# Patient Record
Sex: Male | Born: 1990 | Race: White | Hispanic: No | Marital: Single | Smoking: Former smoker
Health system: Southern US, Community
[De-identification: ages and names within clinical notes are randomized; demographics above are authoritative.]

## PROBLEM LIST (undated history)

## (undated) DIAGNOSIS — R030 Elevated blood-pressure reading, without diagnosis of hypertension: Secondary | ICD-10-CM

## (undated) HISTORY — DX: Elevated blood-pressure reading, without diagnosis of hypertension: R03.0

---

## 2011-11-18 ENCOUNTER — Emergency Department (INDEPENDENT_AMBULATORY_CARE_PROVIDER_SITE_OTHER)
Admission: EM | Admit: 2011-11-18 | Discharge: 2011-11-18 | Disposition: A | Discharge status: Home Health | Payer: Self-pay | Source: Home / Self Care | Attending: Family Medicine | Admitting: Family Medicine

## 2011-11-18 ENCOUNTER — Encounter (HOSPITAL_COMMUNITY): Payer: Self-pay

## 2011-11-18 DIAGNOSIS — N63 Unspecified lump in unspecified breast: Secondary | ICD-10-CM

## 2011-11-18 DIAGNOSIS — J029 Acute pharyngitis, unspecified: Secondary | ICD-10-CM

## 2011-11-18 LAB — POCT RAPID STREP A: Streptococcus, Group A Screen (Direct): NEGATIVE

## 2011-11-18 NOTE — ED Provider Notes (Signed)
Kevin Shields is a 21 y.o. male who presents to Urgent Care today for  1) sore throat and fever for 3-4 days. The patient notes a fever up to 104 relieved by ibuprofen. He denies any trouble breathing vomiting diarrhea or abdominal pain. He is eating and drinking well and producing urine. Additionally he notes some enlarged lymph nodes in his neck.  2) left nipple/breast mass: Patient notes a slowly enlarging small mass near his left nipple. It is nontender and he does not have any fluid expressible from his nipple. He denies any other similar mass on the right side. He feels well otherwise. He has not had this evaluated yet. Patient denies any testicular masses and he does check himself regularly the   PMH reviewed. Otherwise healthy young man ROS as above otherwise neg.  no chest pains, palpitations, fevers, chills, abdominal pain nausea or vomiting. Medications reviewed. No current facility-administered medications for this encounter.   No current outpatient prescriptions on file.    Exam:  BP 128/61  Pulse 102  Temp(Src) 97.8 F (36.6 C) (Oral)  Resp 18  SpO2 98% Gen: Well NAD HEENT: EOMI,  MMM, anterior cervical lymphadenopathy. Posterior pharynx is erythematous with white exudate present. Lungs: CTABL Nl WOB Heart: RRR no MRG Abd: NABS, NT, ND Exts: Non edematous BL  LE, warm and well perfused.  Chest: Left chest wall significant for small approximately 1 cm freely mobile mass near the left nipple without any significant axillary lymphadenopathy. Right breast/chest is normal appearing  Results for orders placed during the hospital encounter of 11/18/11 (from the past 24 hour(s))  POCT RAPID STREP A ([facility] ONLY)     Status: Normal   Collection Time   11/18/11  6:34 PM      Component Value Range   Streptococcus, Group A Screen (Direct) NEGATIVE  NEGATIVE    No results found.  Assessment and Plan: 21 y.o. male with   1) pharyngitis. Likely viral I am suspicious for  mononucleosis. Is likely too early for a Monospot to be positive therefore we'll not obtain it today.  Will treat symptomatically with ibuprofen and follow. If not improving or worsening will return to clinic. Discuss red flag signs or symptoms. Family expresses understanding.  2) breast mass: Somewhat concerning as it appears to be growing. Exam not particularly consistent with enlarged lymph node gynecomastia or firm fixed worrisome mass.  He may have some glandular hypertrophy or other etiology. I feel that referral to [facility] for further evaluation with potential biopsy is warranted at this time. Expressed my concern. Family expresses understanding.     Rodolph Bong, MD 11/18/11 1901

## 2011-11-18 NOTE — ED Notes (Signed)
Pt c/o sore throat and fever.  Pt taking Ibuprofen 800mg  with success in reducing fever.  No relief for pain in throat.  Pt denies productive cough.  Pt also c/o mass beneath L nipple.  Pt states it has been there for approx 1 year.  Painful to palpation.

## 2011-11-18 NOTE — Discharge Instructions (Signed)
Thank you for coming in today.  I think you have mono.  Continue ibuprofen as needed. You should get better over the next few days to weeks If you do not start getting better within one or 2 weeks please come back or go to your doctor Call or go to the emergency room if you get worse, have trouble breathing, have chest pains, or palpitations.    Left chest:  I'm not sure what is causing this. I do think that further workup makes sense.  Please followup with [facility]. Call over there and say that you were referred by urgent care.

## 2011-11-19 NOTE — ED Provider Notes (Signed)
Medical screening examination/treatment/procedure(s) were performed by PGY-3 [facility] resident and as supervising physician I was immediately available for consultation/collaboration.   Sharin Grave, MD   Sharin Grave, MD 11/19/11 [phone]

## 2014-04-18 ENCOUNTER — Emergency Department (INDEPENDENT_AMBULATORY_CARE_PROVIDER_SITE_OTHER)
Admission: EM | Admit: 2014-04-18 | Discharge: 2014-04-18 | Disposition: A | Discharge status: Home / Self Care | Payer: PRIVATE HEALTH INSURANCE | Source: Home / Self Care | Attending: Family Medicine | Admitting: Family Medicine

## 2014-04-18 ENCOUNTER — Encounter (HOSPITAL_COMMUNITY): Payer: Self-pay | Admitting: Emergency Medicine

## 2014-04-18 DIAGNOSIS — R350 Frequency of micturition: Secondary | ICD-10-CM | POA: Diagnosis not present

## 2014-04-18 LAB — POCT I-STAT, CHEM 8
BUN: 14 mg/dL (ref 6–23)
CALCIUM ION: 1.26 mmol/L — AB (ref 1.12–1.23)
CHLORIDE: 101 meq/L (ref 96–112)
Creatinine, Ser: 1 mg/dL (ref 0.50–1.35)
GLUCOSE: 101 mg/dL — AB (ref 70–99)
HEMATOCRIT: 50 % (ref 39.0–52.0)
Hemoglobin: 17 g/dL (ref 13.0–17.0)
Potassium: 3.9 mEq/L (ref 3.7–5.3)
Sodium: 139 mEq/L (ref 137–147)
TCO2: 28 mmol/L (ref 0–100)

## 2014-04-18 NOTE — ED Notes (Signed)
No answer in lobby x 2.

## 2014-04-18 NOTE — ED Provider Notes (Signed)
CSN: [id number]     Arrival date & time 04/18/14  1428 History   First MD Initiated Contact with Patient 04/18/14 1534     Chief Complaint  Patient presents with  . Urinary Frequency   (Consider location/radiation/quality/duration/timing/severity/associated sxs/prior Treatment) Patient is a 23 y.o. male presenting with frequency. The history is provided by the patient.  Urinary Frequency This is a new problem. The current episode started 2 days ago. Pertinent negatives include no chest pain and no abdominal pain. Associated symptoms comments: Urinary freq.    History reviewed. No pertinent past medical history. History reviewed. No pertinent past surgical history. History reviewed. No pertinent family history. History  Substance Use Topics  . Smoking status: Current Every Day Smoker  . Smokeless tobacco: Not on file  . Alcohol Use: Yes    Review of Systems  Constitutional: Negative.   Cardiovascular: Negative for chest pain.  Gastrointestinal: Negative.  Negative for abdominal pain.  Genitourinary: Positive for frequency. Negative for dysuria and urgency.    Allergies  Penicillins and Vantin  Home Medications   Prior to Admission medications   Not on File   BP 132/91  Pulse 59  Temp(Src) 98.2 F (36.8 C) (Oral)  Resp 16  SpO2 98% Physical Exam  Nursing note and vitals reviewed. Constitutional: He is oriented to person, place, and time. He appears well-developed and well-nourished.  Abdominal: Soft. Bowel sounds are normal. He exhibits no distension and no mass. There is no tenderness. There is no rebound and no guarding.  Neurological: He is alert and oriented to person, place, and time.    ED Course  Procedures (including critical care time) Labs Review Labs Reviewed  POCT I-STAT, CHEM 8 - Abnormal; Notable for the following:    Glucose, Bld 101 (*)    Calcium, Ion 1.26 (*)    All other components within normal limits   i-stat wnl. Imaging Review No  results found.   MDM   1. Urinary frequency        Linna Hoff, MD 04/18/14 [phone]

## 2014-04-18 NOTE — Discharge Instructions (Signed)
Your sugar test was 101. No diabetes. See your doctor for check-up and other medical issues.

## 2014-04-18 NOTE — ED Notes (Signed)
Pt  Has  Numerous   Complaints  He  Wants  To  Be  Checked  For  Diabetes       As  Well   As     Dry  Skin           He  States  He  Wants  To  Be  Checked  For  All  Types  Of  Std        Yet  He  Has  No  Symptoms           pt  denys  Any  Chest  Pain  But  States  sev  Years  Ago  He  Was  Referred  To a  Cardiologist  But      Never  Micah Flesher  Because  He  Lost  His  Papers               Pt     Is  Sitting  Upright on  Exam table  Speaking in  Complete  sentances  And  Is  In no  Severe     Distress

## 2016-02-15 ENCOUNTER — Ambulatory Visit (INDEPENDENT_AMBULATORY_CARE_PROVIDER_SITE_OTHER): Payer: BC Managed Care – PPO | Admitting: Primary Care

## 2016-02-15 ENCOUNTER — Encounter: Payer: Self-pay | Admitting: Primary Care

## 2016-02-15 VITALS — BP 148/98 | HR 79 | Temp 97.7°F | Ht 70.0 in | Wt 219.0 lb

## 2016-02-15 DIAGNOSIS — L309 Dermatitis, unspecified: Secondary | ICD-10-CM | POA: Diagnosis not present

## 2016-02-15 DIAGNOSIS — I10 Essential (primary) hypertension: Secondary | ICD-10-CM | POA: Insufficient documentation

## 2016-02-15 DIAGNOSIS — Z113 Encounter for screening for infections with a predominantly sexual mode of transmission: Secondary | ICD-10-CM

## 2016-02-15 DIAGNOSIS — R030 Elevated blood-pressure reading, without diagnosis of hypertension: Secondary | ICD-10-CM | POA: Diagnosis not present

## 2016-02-15 MED ORDER — TRIAMCINOLONE ACETONIDE 0.1 % EX CREA: 1.0000 "application " | TOPICAL_CREAM | Freq: Two times a day (BID) | CUTANEOUS | Status: DC

## 2016-02-15 NOTE — Progress Notes (Signed)
Pre visit review using our clinic review tool, if applicable. No additional management support is needed unless otherwise documented below in the visit note. 

## 2016-02-15 NOTE — Progress Notes (Signed)
Subjective:    Patient ID: Kevin Shields, male    DOB: 12/03/1990, 25 y.o.   MRN: [id number]  HPI  Mr. Kevin Shields is a 25 year old male who presents today to establish care and discuss the problems mentioned below. Will obtain old records.  1) Elevated Blood Pressure Reading: Above goal in the clinic today at 148/98, no improvement upon recheck. History of elevated reading in September 2015 of 132/91. He's not recently checked his blood pressure. Denies headaches, dizziness, chest pain, visual changes. He has a family history of hypertension in his mother and grandmother. He endorses a poor diet with fast food and salty food consumption. He has gained 50 pounds in the last 1 year.  His diet consists of: Breakfast: Skips Lunch: Restaurants ([place] food, Country food) Dinner: [employer] sales [employer]izza, frozen foods Snacks: Yogurt, Ice cream, Chips Dessert: 2-3 times weekly Beverages: Water, Gatorade, soda, sweet tea  Exercise: He works out at [employer] 3-4 times weekly.  Wt Readings from Last 3 Encounters:  02/15/16 219 lb (99.338 kg)     2) Eczema: Redness and dryness to skin to bilateral nasal folds, right eye brow, and behind right ear that has been present for the past 1 year. He's been applying an OTC cream (that he cannot recall the name of) with temporary improvement. He does experience mild itching. He has noticed increased symptoms when working outdoors in the heat.   3) STI Testing: He's not been sexually active in 3 years, but would like to undergo evaluation as he was never tested prior. Denies penile discharge, itching, swelling.   Review of Systems  Eyes: Negative for visual disturbance.  Respiratory: Negative for shortness of breath.   Cardiovascular: Negative for chest pain.  Genitourinary: Negative for dysuria, frequency, discharge, penile swelling, scrotal swelling, penile pain and testicular pain.  Skin: Positive for rash.  Neurological: Negative for dizziness and headaches.         Past Medical History  Diagnosis Date  . Elevated blood pressure reading      Social History   Social History  . Marital Status: Single    Spouse Name: N/A  . Number of Children: N/A  . Years of Education: N/A   Occupational History  . Not on file.   Social History Main Topics  . Smoking status: Former Games developermoker  . Smokeless tobacco: Not on file     Comment: Quit 3 yeaars ago  . Alcohol Use: No  . Drug Use: No  . Sexual Activity: Not on file   Other Topics Concern  . Not on file   Social History Narrative   Single.   No children.   Works in [employer].   Enjoys spending time outdoors, baseball, basketball.    No past surgical history on file.  Family History  Problem Relation Age of Onset  . Hypertension Mother   . Diabetes Mother     Allergies  Allergen Reactions  . Penicillins Hives  . Vantin [Cefpodoxime]     No current outpatient prescriptions on file prior to visit.   No current facility-administered medications on file prior to visit.    BP 148/98 mmHg  Pulse 79  Temp(Src) 97.7 F (36.5 C) (Oral)  Ht 5\' 10"  (1.778 m)  Wt 219 lb (99.338 kg)  BMI 31.42 kg/m2  SpO2 98%    Objective:   Physical Exam  Constitutional: He is oriented to person, place, and time. He appears well-nourished.  Neck: Neck supple.  Cardiovascular: Normal rate and  regular rhythm.   Pulmonary/Chest: Effort normal and breath sounds normal. He has no wheezes. He has no rales.  Neurological: He is alert and oriented to person, place, and time.  Skin: Skin is warm and dry.  Mild rash to bilateral nasal folds, behind right ear, to right eyebrow. Very representative of eczema.  Psychiatric: He has a normal mood and affect.          Assessment & Plan:

## 2016-02-15 NOTE — Assessment & Plan Note (Signed)
Elevated in clinic today, even on recheck. 1 prior documentation of elevated blood pressure in chart from 2015. He's not recently checked and endorses a poor diet with no exercise. Discussed importance of regular exercise and improvement in diet to reduce blood pressure, especially given family history. Information provided regarding DASH diet. We will have him monitor blood pressure and notify me if at or above 140/90 consistently. Will allow him time to lose weight.

## 2016-02-15 NOTE — Assessment & Plan Note (Signed)
Located to bilateral nasal folds, behind right ear, right eyebrow. Prescription for low potency triamcinolone cream sent to pharmacy for twice-daily application 1-2 weeks. He is to notify me if no improvement.

## 2016-02-15 NOTE — Patient Instructions (Addendum)
Work to [employer] by decreasing fast food, pizza, salty foods, sugary drinks.  Increase consumption of vegetables, fruit, lean protein, water.  Your blood pressure should run below 140/90. Anything at or above this number is too high.  Ensure you are consuming 64 ounces of water daily.  Continue exercising.  Complete lab work prior to leaving today. I will notify you of your results once received.   Apply Triamcinolone cream twice daily to affected areas for 1-2 weeks.   Please notify me if no improvement.  It was a pleasure to meet you today! Please don't hesitate to call me with any questions. Welcome to [facility]!  DASH Eating Plan DASH stands for "Dietary Approaches to Stop Hypertension." The DASH eating plan is a healthy eating plan that has been shown to reduce high blood pressure (hypertension). Additional health benefits may include reducing the risk of type 2 diabetes mellitus, heart disease, and stroke. The DASH eating plan may also help with weight loss. WHAT DO I NEED TO KNOW ABOUT THE DASH EATING PLAN? For the DASH eating plan, you will follow these general guidelines:  Choose foods with a percent daily value for sodium of less than 5% (as listed on the food label).  Use salt-free seasonings or herbs instead of table salt or sea salt.  Check with your health care provider or pharmacist before using salt substitutes.  Eat lower-sodium products, often labeled as "lower sodium" or "no salt added."  Eat fresh foods.  Eat more vegetables, fruits, and low-fat dairy products.  Choose whole grains. Look for the word "whole" as the first word in the ingredient list.  Choose fish and skinless chicken or [place] more often than red meat. Limit fish, poultry, and meat to 6 oz (170 g) each day.  Limit sweets, desserts, sugars, and sugary drinks.  Choose heart-healthy fats.  Limit cheese to 1 oz (28 g) per day.  Eat more home-cooked food and less restaurant, buffet,  and fast food.  Limit fried foods.  Cook foods using methods other than frying.  Limit canned vegetables. If you do use them, rinse them well to decrease the sodium.  When eating at a restaurant, ask that your food be prepared with less salt, or no salt if possible. WHAT FOODS CAN I EAT? Seek help from a dietitian for individual calorie needs. Grains Whole grain or whole wheat bread. Brown rice. Whole grain or whole wheat pasta. Quinoa, bulgur, and whole grain cereals. Low-sodium cereals. Corn or whole wheat flour tortillas. Whole grain cornbread. Whole grain crackers. Low-sodium crackers. Vegetables Fresh or frozen vegetables (raw, steamed, roasted, or grilled). Low-sodium or reduced-sodium tomato and vegetable juices. Low-sodium or reduced-sodium tomato sauce and paste. Low-sodium or reduced-sodium canned vegetables.  Fruits All fresh, canned (in natural juice), or frozen fruits. Meat and Other Protein Products Ground beef (85% or leaner), grass-fed beef, or beef trimmed of fat. Skinless chicken or [place]. Ground chicken or [place]. Pork trimmed of fat. All fish and seafood. Eggs. Dried beans, peas, or lentils. Unsalted nuts and seeds. Unsalted canned beans. Dairy Low-fat dairy products, such as skim or 1% milk, 2% or reduced-fat cheeses, low-fat ricotta or cottage cheese, or plain low-fat yogurt. Low-sodium or reduced-sodium cheeses. Fats and Oils Tub margarines without trans fats. Light or reduced-fat mayonnaise and salad dressings (reduced sodium). Avocado. Safflower, olive, or canola oils. Natural peanut or almond butter. Other Unsalted popcorn and pretzels. The items listed above may not be a complete list of recommended foods or  beverages. Contact your dietitian for more options. WHAT FOODS ARE NOT RECOMMENDED? Grains White bread. White pasta. White rice. Refined cornbread. Bagels and croissants. Crackers that contain trans fat. Vegetables Creamed or fried vegetables. Vegetables  in a cheese sauce. Regular canned vegetables. Regular canned tomato sauce and paste. Regular tomato and vegetable juices. Fruits Dried fruits. Canned fruit in light or heavy syrup. Fruit juice. Meat and Other Protein Products Fatty cuts of meat. Ribs, chicken wings, bacon, sausage, bologna, salami, chitterlings, fatback, hot dogs, bratwurst, and packaged luncheon meats. Salted nuts and seeds. Canned beans with salt. Dairy Whole or 2% milk, cream, half-and-half, and cream cheese. Whole-fat or sweetened yogurt. Full-fat cheeses or blue cheese. Nondairy creamers and whipped toppings. Processed cheese, cheese spreads, or cheese curds. Condiments Onion and garlic salt, seasoned salt, table salt, and sea salt. Canned and packaged gravies. Worcestershire sauce. Tartar sauce. Barbecue sauce. Teriyaki sauce. Soy sauce, including reduced sodium. Steak sauce. Fish sauce. Oyster sauce. Cocktail sauce. Horseradish. Ketchup and mustard. Meat flavorings and tenderizers. Bouillon cubes. Hot sauce. Tabasco sauce. Marinades. Taco seasonings. Relishes. Fats and Oils Butter, stick margarine, lard, shortening, ghee, and bacon fat. Coconut, palm kernel, or palm oils. Regular salad dressings. Other Pickles and olives. Salted popcorn and pretzels. The items listed above may not be a complete list of foods and beverages to avoid. Contact your dietitian for more information. WHERE CAN I FIND MORE INFORMATION? National Heart, Lung, and Blood Institute: [employer].itwww.nhlbi.nih.gov/health/health-topics/topics/dash/   This information is not intended to replace advice given to you by your health care provider. Make sure you discuss any questions you have with your health care provider.   Document Released: 07/07/2011 Document Revised: 08/08/2014 Document Reviewed: 05/22/2013 Elsevier Interactive Patient Education [employer]2016 Elsevier Inc.

## 2016-02-16 LAB — COMPREHENSIVE METABOLIC PANEL
ALT: 44 U/L (ref 0–53)
AST: 26 U/L (ref 0–37)
Albumin: 4.9 g/dL (ref 3.5–5.2)
Alkaline Phosphatase: 95 U/L (ref 39–117)
BILIRUBIN TOTAL: 0.5 mg/dL (ref 0.2–1.2)
BUN: 12 mg/dL (ref 6–23)
CHLORIDE: 102 meq/L (ref 96–112)
CO2: 30 meq/L (ref 19–32)
Calcium: 10.2 mg/dL (ref 8.4–10.5)
Creatinine, Ser: 1.19 mg/dL (ref 0.40–1.50)
GFR: 79.64 mL/min (ref >60.00)
GLUCOSE: 81 mg/dL (ref 70–99)
Potassium: 3.9 mEq/L (ref 3.5–5.1)
Sodium: 141 mEq/L (ref 135–145)
Total Protein: 7.8 g/dL (ref 6.0–8.3)

## 2016-02-16 LAB — GC/CHLAMYDIA PROBE AMP
CT PROBE, AMP APTIMA: NOT DETECTED
GC PROBE AMP APTIMA: NOT DETECTED

## 2016-02-16 LAB — HIV ANTIBODY (ROUTINE TESTING W REFLEX): HIV 1&2 Ab, 4th Generation: NONREACTIVE

## 2016-02-16 LAB — HEPATITIS C ANTIBODY: HCV Ab: NEGATIVE

## 2016-02-16 LAB — RPR

## 2016-02-17 LAB — HSV(HERPES SMPLX)ABS-I+II(IGG+IGM)-BLD
HSV 1 Glycoprotein G Ab, IgG: 10.1 Index — ABNORMAL HIGH (ref <0.90)
Herpes Simplex Vrs I&II-IgM Ab (EIA): 0.48 INDEX

## 2016-05-04 ENCOUNTER — Encounter: Payer: Self-pay | Admitting: Primary Care

## 2016-05-04 ENCOUNTER — Ambulatory Visit (INDEPENDENT_AMBULATORY_CARE_PROVIDER_SITE_OTHER): Payer: BC Managed Care – PPO | Admitting: Primary Care

## 2016-05-04 VITALS — BP 142/100 | HR 84 | Temp 97.9°F | Ht 70.0 in | Wt 225.8 lb

## 2016-05-04 DIAGNOSIS — I10 Essential (primary) hypertension: Secondary | ICD-10-CM

## 2016-05-04 MED ORDER — LISINOPRIL 10 MG PO TABS: 10.0000 mg | ORAL_TABLET | Freq: Every day | ORAL | 0 refills | Status: DC

## 2016-05-04 NOTE — Patient Instructions (Signed)
Start Lisinopril 10 mg tablets for high blood pressure. Take 1 tablet by mouth every morning.   You must work on improvements in diet including reduction in salty, fried, fatty foods. Take a look at the information below.  Start exercising. You should be getting 150 minutes of moderate intensity exercise weekly.  Follow up in 3 weeks for re-evaluation.  It was a pleasure to see you today!  DASH Eating Plan DASH stands for "Dietary Approaches to Stop Hypertension." The DASH eating plan is a healthy eating plan that has been shown to reduce high blood pressure (hypertension). Additional health benefits may include reducing the risk of type 2 diabetes mellitus, heart disease, and stroke. The DASH eating plan may also help with weight loss. WHAT DO I NEED TO KNOW ABOUT THE DASH EATING PLAN? For the DASH eating plan, you will follow these general guidelines:  Choose foods with a percent daily value for sodium of less than 5% (as listed on the food label).  Use salt-free seasonings or herbs instead of table salt or sea salt.  Check with your health care provider or pharmacist before using salt substitutes.  Eat lower-sodium products, often labeled as "lower sodium" or "no salt added."  Eat fresh foods.  Eat more vegetables, fruits, and low-fat dairy products.  Choose whole grains. Look for the word "whole" as the first word in the ingredient list.  Choose fish and skinless chicken or [place] more often than red meat. Limit fish, poultry, and meat to 6 oz (170 g) each day.  Limit sweets, desserts, sugars, and sugary drinks.  Choose heart-healthy fats.  Limit cheese to 1 oz (28 g) per day.  Eat more home-cooked food and less restaurant, buffet, and fast food.  Limit fried foods.  Cook foods using methods other than frying.  Limit canned vegetables. If you do use them, rinse them well to decrease the sodium.  When eating at a restaurant, ask that your food be prepared with less  salt, or no salt if possible. WHAT FOODS CAN I EAT? Seek help from a dietitian for individual calorie needs. Grains Whole grain or whole wheat bread. Brown rice. Whole grain or whole wheat pasta. Quinoa, bulgur, and whole grain cereals. Low-sodium cereals. Corn or whole wheat flour tortillas. Whole grain cornbread. Whole grain crackers. Low-sodium crackers. Vegetables Fresh or frozen vegetables (raw, steamed, roasted, or grilled). Low-sodium or reduced-sodium tomato and vegetable juices. Low-sodium or reduced-sodium tomato sauce and paste. Low-sodium or reduced-sodium canned vegetables.  Fruits All fresh, canned (in natural juice), or frozen fruits. Meat and Other Protein Products Ground beef (85% or leaner), grass-fed beef, or beef trimmed of fat. Skinless chicken or [place]. Ground chicken or [place]. Pork trimmed of fat. All fish and seafood. Eggs. Dried beans, peas, or lentils. Unsalted nuts and seeds. Unsalted canned beans. Dairy Low-fat dairy products, such as skim or 1% milk, 2% or reduced-fat cheeses, low-fat ricotta or cottage cheese, or plain low-fat yogurt. Low-sodium or reduced-sodium cheeses. Fats and Oils Tub margarines without trans fats. Light or reduced-fat mayonnaise and salad dressings (reduced sodium). Avocado. Safflower, olive, or canola oils. Natural peanut or almond butter. Other Unsalted popcorn and pretzels. The items listed above may not be a complete list of recommended foods or beverages. Contact your dietitian for more options. WHAT FOODS ARE NOT RECOMMENDED? Grains White bread. White pasta. White rice. Refined cornbread. Bagels and croissants. Crackers that contain trans fat. Vegetables Creamed or fried vegetables. Vegetables in a cheese sauce. Regular canned vegetables.  Regular canned tomato sauce and paste. Regular tomato and vegetable juices. Fruits Dried fruits. Canned fruit in light or heavy syrup. Fruit juice. Meat and Other Protein Products Fatty cuts of  meat. Ribs, chicken wings, bacon, sausage, bologna, salami, chitterlings, fatback, hot dogs, bratwurst, and packaged luncheon meats. Salted nuts and seeds. Canned beans with salt. Dairy Whole or 2% milk, cream, half-and-half, and cream cheese. Whole-fat or sweetened yogurt. Full-fat cheeses or blue cheese. Nondairy creamers and whipped toppings. Processed cheese, cheese spreads, or cheese curds. Condiments Onion and garlic salt, seasoned salt, table salt, and sea salt. Canned and packaged gravies. Worcestershire sauce. Tartar sauce. Barbecue sauce. Teriyaki sauce. Soy sauce, including reduced sodium. Steak sauce. Fish sauce. Oyster sauce. Cocktail sauce. Horseradish. Ketchup and mustard. Meat flavorings and tenderizers. Bouillon cubes. Hot sauce. Tabasco sauce. Marinades. Taco seasonings. Relishes. Fats and Oils Butter, stick margarine, lard, shortening, ghee, and bacon fat. Coconut, palm kernel, or palm oils. Regular salad dressings. Other Pickles and olives. Salted popcorn and pretzels. The items listed above may not be a complete list of foods and beverages to avoid. Contact your dietitian for more information. WHERE CAN I FIND MORE INFORMATION? National Heart, Lung, and Blood Institute: CableProm[employer].itwww.nhlbi.nih.gov/health/health-topics/topics/dash/   This information is not intended to replace advice given to you by your health care provider. Make sure you discuss any questions you have with your health care provider.   Document Released: 07/07/2011 Document Revised: 08/08/2014 Document Reviewed: 05/22/2013 Elsevier Interactive Patient Education [employer]2016 Elsevier Inc.

## 2016-05-04 NOTE — Assessment & Plan Note (Signed)
BP ranging in the 140's-90-100's since last visit. Long discussion today regarding importance of lowering BP through healthy diet and exercising. He doesn't feel as though he can exercise or improve his diet. Given lack of motivation to lose weight/improve his diet, elevated BP levels on multiple occurences, and family history, will initiate treatment. Rx for Lisinopril 10 mg sent to pharmacy. He will monitor his BP for the next several weeks and bring readings to follow up appointment. Follow up in 3 weeks for re-evaluation and BMP. Information regarding DASH diet provided.

## 2016-05-04 NOTE — Progress Notes (Signed)
   Subjective:    Patient ID: Kevin Shields, male    DOB: 12/30/1990, 25 y.o.   MRN: [id number]  HPI  Kevin Shields is a 25 year old male who presents today with a chief complaint of elevated blood pressure. He was evaluated as a new patient in July 2017 and noted to have at BP reading of 148/98. He had not recently checked his BP before that date so he was encouraged to lose weight through DASH diet and exercise. He has a family history of hypertension in his mother and grandmother.  Since his last visit he checked his BP yesterday at a wellness fair which was 140/90. He's checked his BP at home several times since his last visit which is running about the same.  His BP in the office today is above goal at 142/100. He's not been trying to lose weight as recommended. Denies headaches, changes in vision, dizziness.    Review of Systems  Eyes: Negative for visual disturbance.  Respiratory: Negative for shortness of breath.   Cardiovascular: Negative for chest pain and leg swelling.  Neurological: Negative for dizziness and headaches.       Past Medical History:  Diagnosis Date  . Elevated blood pressure reading      Social History   Social History  . Marital status: Single    Spouse name: N/A  . Number of children: N/A  . Years of education: N/A   Occupational History  . Not on file.   Social History Main Topics  . Smoking status: Former Games developermoker  . Smokeless tobacco: Not on file     Comment: Quit 3 yeaars ago  . Alcohol use No  . Drug use: No  . Sexual activity: Not on file   Other Topics Concern  . Not on file   Social History Narrative   Single.   No children.   Works in [employer] representative[employer].   Enjoys spending time outdoors, baseball, basketball.    No past surgical history on file.  Family History  Problem Relation Age of Onset  . Hypertension Mother   . Diabetes Mother     Allergies  Allergen Reactions  . Penicillins Hives  . Vantin [Cefpodoxime]     Current  Outpatient Prescriptions on File Prior to Visit  Medication Sig Dispense Refill  . triamcinolone cream (KENALOG) 0.1 % Apply 1 application topically 2 (two) times daily. (Patient not taking: Reported on 05/04/2016) 30 g 0   No current facility-administered medications on file prior to visit.     BP (!) 142/100   Pulse 84   Temp 97.9 F (36.6 C) (Oral)   Ht 5\' 10"  (1.778 m)   Wt 225 lb 12.8 oz (102.4 kg)   SpO2 98%   BMI 32.40 kg/m    Objective:   Physical Exam  Constitutional: He appears well-nourished.  Neck: Neck supple.  Cardiovascular: Normal rate and regular rhythm.   Pulmonary/Chest: Effort normal and breath sounds normal.  Skin: Skin is warm and dry.          Assessment & Plan:

## 2016-05-04 NOTE — Progress Notes (Signed)
Pre visit review using our clinic review tool, if applicable. No additional management support is needed unless otherwise documented below in the visit note. 

## 2016-05-25 ENCOUNTER — Ambulatory Visit: Payer: BC Managed Care – PPO | Admitting: Primary Care

## 2016-07-04 ENCOUNTER — Telehealth: Payer: Self-pay | Admitting: Primary Care

## 2016-07-04 MED ORDER — TRIAMCINOLONE ACETONIDE 0.1 % EX CREA: 1.0000 "application " | TOPICAL_CREAM | Freq: Two times a day (BID) | CUTANEOUS | 0 refills | Status: DC

## 2016-07-04 NOTE — Telephone Encounter (Signed)
I really need to see him back in the office for follow up and lab (BMP) since we started him on Lisinopril. Will you please schedule at his convenience.

## 2016-07-04 NOTE — Telephone Encounter (Signed)
Spoken to patient and blood pressure reading are better. Like 130's over 80-70 on most days. It has not been over 140/90. Patient is trying to improved his diet, a little bit at a time.

## 2016-07-04 NOTE — Telephone Encounter (Signed)
-----   Message from Doreene NestKatherine K Vina Byrd, NP sent at 02/15/2016  5:12 PM EDT ----- Regarding: BP Please ask patient if he's been checking his blood pressure. Has he improved his diet?

## 2016-07-05 NOTE — Telephone Encounter (Signed)
Message left for patient to return my call.  

## 2016-07-11 NOTE — Telephone Encounter (Signed)
Patient notified and appt scheduled.

## 2016-07-18 ENCOUNTER — Ambulatory Visit (INDEPENDENT_AMBULATORY_CARE_PROVIDER_SITE_OTHER): Payer: BC Managed Care – PPO | Admitting: Primary Care

## 2016-07-18 ENCOUNTER — Encounter: Payer: Self-pay | Admitting: Primary Care

## 2016-07-18 VITALS — BP 140/92 | HR 68 | Temp 98.1°F | Ht 70.0 in | Wt 223.4 lb

## 2016-07-18 DIAGNOSIS — I10 Essential (primary) hypertension: Secondary | ICD-10-CM | POA: Diagnosis not present

## 2016-07-18 DIAGNOSIS — Z0000 Encounter for general adult medical examination without abnormal findings: Secondary | ICD-10-CM | POA: Insufficient documentation

## 2016-07-18 DIAGNOSIS — Z23 Encounter for immunization: Secondary | ICD-10-CM

## 2016-07-18 DIAGNOSIS — Z111 Encounter for screening for respiratory tuberculosis: Secondary | ICD-10-CM

## 2016-07-18 LAB — POC URINALSYSI DIPSTICK (AUTOMATED)
BILIRUBIN UA: NEGATIVE
Glucose, UA: NEGATIVE
KETONES UA: NEGATIVE
LEUKOCYTES UA: NEGATIVE
Nitrite, UA: NEGATIVE
PH UA: 6
Protein, UA: NEGATIVE
RBC UA: NEGATIVE
Spec Grav, UA: >=1.03
UROBILINOGEN UA: NEGATIVE

## 2016-07-18 MED ORDER — LISINOPRIL 10 MG PO TABS: 10.0000 mg | ORAL_TABLET | Freq: Every day | ORAL | 1 refills | Status: DC

## 2016-07-18 NOTE — Progress Notes (Signed)
Pre visit review using our clinic review tool, if applicable. No additional management support is needed unless otherwise documented below in the visit note. 

## 2016-07-18 NOTE — Assessment & Plan Note (Signed)
Tdap due, provided today. Influenza vaccination UTD. Discussed the importance of a healthy diet and regular exercise in order for weight loss, and to reduce the risk of other medical diseases. Exam unremarkable. Labs pending. Form partially completed and will be completed in full once TB test has resulted.

## 2016-07-18 NOTE — Patient Instructions (Addendum)
Return to have your TB skin test read as directed. Please ask for your completed form.  It's importance to improve your diet by reducing consumption of fast food, fried food, processed snack foods, sugary drinks. Increase consumption of fresh vegetables and fruits, whole grains, water.  Ensure you are drinking 64 ounces of water daily.  Start exercising. You should be getting 150 minutes of moderate intensity exercise weekly.  Complete lab work prior to leaving today. I will notify you of your results once received.   I sent 6 months worth of your blood pressure medication to your pharmacy.  Please establish with a new PCP in [place].  It was a pleasure to see you today!

## 2016-07-18 NOTE — Assessment & Plan Note (Signed)
Slightly above goal today. Endorses compliance to lisinopril and stable home readings. Continue current regimen. Refills sent to pharmacy. Discussed that he will need to establish with a new PCP in [place] for further monitoring.

## 2016-07-18 NOTE — Progress Notes (Signed)
Subjective:    Patient ID: Kevin Shields, male    DOB: 08/22/1990, 25 y.o.   MRN: [id number]  HPI  Kevin Shields is a 25 year old male who presents today for complete physical and form completion for school.  Immunizations: -Tetanus: Unsure, believes it's been over 10 years.  -Influenza: Completed in October 2017   Diet: He endorses a fair diet. Breakfast: Skips Lunch: Restaurants, chicken sandwiches, fries. Dinner: Vegetables, meat Snacks: Occasionally, chips, snack cakes Desserts: 3-4 times weekly Beverages: Water, sweet tea, some soda  Exercise: He is not currently exercising regularly. Eye exam: Completed several years ago. No changes in vision. Dental exam: Completes semi-annually.    Review of Systems  Constitutional: Negative for unexpected weight change.  HENT: Negative for rhinorrhea.   Respiratory: Negative for cough and shortness of breath.   Cardiovascular: Negative for chest pain.  Gastrointestinal: Negative for constipation and diarrhea.  Genitourinary: Negative for difficulty urinating.  Musculoskeletal: Negative for arthralgias and myalgias.  Skin: Negative for rash.  Allergic/Immunologic: Negative for environmental allergies.  Neurological: Negative for dizziness, numbness and headaches.  Psychiatric/Behavioral:       He denies concerns for anxiety or depression       Past Medical History:  Diagnosis Date  . Elevated blood pressure reading      Social History   Social History  . Marital status: Single    Spouse name: N/A  . Number of children: N/A  . Years of education: N/A   Occupational History  . Not on file.   Social History Main Topics  . Smoking status: Former Games developermoker  . Smokeless tobacco: Not on file     Comment: Quit 3 yeaars ago  . Alcohol use No  . Drug use: No  . Sexual activity: Not on file   Other Topics Concern  . Not on file   Social History Narrative   Single.   No children.   Works in [employer] representative[employer].   Enjoys spending  time outdoors, baseball, basketball.    No past surgical history on file.  Family History  Problem Relation Age of Onset  . Hypertension Mother   . Diabetes Mother     Allergies  Allergen Reactions  . Penicillins Hives  . Vantin [Cefpodoxime]     Current Outpatient Prescriptions on File Prior to Visit  Medication Sig Dispense Refill  . triamcinolone cream (KENALOG) 0.1 % Apply 1 application topically 2 (two) times daily. 30 g 0   No current facility-administered medications on file prior to visit.     BP (!) 140/92   Pulse 68   Temp 98.1 F (36.7 C) (Oral)   Ht 5\' 10"  (1.778 m)   Wt 223 lb 6.4 oz (101.3 kg)   SpO2 98%   BMI 32.05 kg/m    Objective:   Physical Exam  Constitutional: He is oriented to person, place, and time. He appears well-nourished.  HENT:  Right Ear: Tympanic membrane and ear canal normal.  Left Ear: Tympanic membrane and ear canal normal.  Nose: Nose normal. Right sinus exhibits no maxillary sinus tenderness and no frontal sinus tenderness. Left sinus exhibits no maxillary sinus tenderness and no frontal sinus tenderness.  Mouth/Throat: Oropharynx is clear and moist.  Eyes: Conjunctivae and EOM are normal. Pupils are equal, round, and reactive to light.  Neck: Neck supple. Carotid bruit is not present. No thyromegaly present.  Cardiovascular: Normal rate, regular rhythm and normal heart sounds.   Pulmonary/Chest: Effort normal and breath sounds normal.  He has no wheezes. He has no rales.  Abdominal: Soft. Bowel sounds are normal. There is no tenderness.  Musculoskeletal: Normal range of motion.  Neurological: He is alert and oriented to person, place, and time. He has normal reflexes. No cranial nerve deficit.  Skin: Skin is warm and dry.  Psychiatric: He has a normal mood and affect.          Assessment & Plan:

## 2016-07-19 LAB — LDL CHOLESTEROL, DIRECT: Direct LDL: 97 mg/dL

## 2016-07-19 LAB — COMPREHENSIVE METABOLIC PANEL WITH GFR
ALT: 41 U/L (ref 0–53)
AST: 27 U/L (ref 0–37)
Albumin: 5 g/dL (ref 3.5–5.2)
Alkaline Phosphatase: 100 U/L (ref 39–117)
BUN: 13 mg/dL (ref 6–23)
CO2: 31 meq/L (ref 19–32)
Calcium: 10.1 mg/dL (ref 8.4–10.5)
Chloride: 99 meq/L (ref 96–112)
Creatinine, Ser: 1.15 mg/dL (ref 0.40–1.50)
GFR: 82.56 mL/min
Glucose, Bld: 88 mg/dL (ref 70–99)
Potassium: 4.8 meq/L (ref 3.5–5.1)
Sodium: 138 meq/L (ref 135–145)
Total Bilirubin: 0.6 mg/dL (ref 0.2–1.2)
Total Protein: 7.8 g/dL (ref 6.0–8.3)

## 2016-07-19 LAB — LIPID PANEL
CHOLESTEROL: 183 mg/dL (ref 0–200)
HDL: 29.5 mg/dL — ABNORMAL LOW (ref >39.00)
Total CHOL/HDL Ratio: 6

## 2016-07-20 ENCOUNTER — Encounter: Payer: Self-pay | Admitting: *Deleted

## 2016-07-20 LAB — TB SKIN TEST
INDURATION: 0 mm
TB SKIN TEST: NEGATIVE

## 2018-01-16 ENCOUNTER — Encounter (HOSPITAL_COMMUNITY): Payer: Self-pay | Admitting: Emergency Medicine

## 2018-01-16 ENCOUNTER — Ambulatory Visit (HOSPITAL_COMMUNITY)
Admission: EM | Admit: 2018-01-16 | Discharge: 2018-01-16 | Disposition: A | Discharge status: Home / Self Care | Payer: BLUE CROSS/BLUE SHIELD | Attending: Internal Medicine | Admitting: Internal Medicine

## 2018-01-16 ENCOUNTER — Ambulatory Visit (HOSPITAL_COMMUNITY): Payer: BLUE CROSS/BLUE SHIELD

## 2018-01-16 ENCOUNTER — Other Ambulatory Visit: Payer: Self-pay

## 2018-01-16 ENCOUNTER — Ambulatory Visit (INDEPENDENT_AMBULATORY_CARE_PROVIDER_SITE_OTHER): Payer: BLUE CROSS/BLUE SHIELD

## 2018-01-16 DIAGNOSIS — Y9367 Activity, basketball: Secondary | ICD-10-CM

## 2018-01-16 DIAGNOSIS — S99912A Unspecified injury of left ankle, initial encounter: Secondary | ICD-10-CM | POA: Diagnosis not present

## 2018-01-16 MED ORDER — MELOXICAM 7.5 MG PO TABS: 7.5000 mg | ORAL_TABLET | Freq: Every day | ORAL | 0 refills | Status: DC

## 2018-01-16 NOTE — ED Provider Notes (Signed)
[facility]    CSN: [id number] Arrival date & time: 01/16/18  1922     History   Chief Complaint Chief Complaint  Patient presents with  . Ankle Pain    HPI Santosh Petter is a 27 y.o. male.   27 year old male comes in for left ankle pain after injury yesterday.  He was playing basketball, when he landed on his left ankle inverted.  States he heard a pop.  Has had swelling to the lateral side of the ankle, for which he has been applying ice with mild improvement.  Denies numbness, tingling.  States at rest does not have significant pain, but if he tries to move his ankle, or weight-bear, he has significant pain.  Has still been able to limp around.  Ibuprofen with some relief.     Past Medical History:  Diagnosis Date  . Elevated blood pressure reading     Patient Active Problem List   Diagnosis Date Noted  . Preventative health care 07/18/2016  . Essential hypertension 02/15/2016  . Eczema 02/15/2016    History reviewed. No pertinent surgical history.     Home Medications    Prior to Admission medications   Medication Sig Start Date End Date Taking? Authorizing Provider  lisinopril (PRINIVIL,ZESTRIL) 10 MG tablet Take 1 tablet (10 mg total) by mouth daily. Patient taking differently: Take 10 mg by mouth daily. Non-compliant 07/18/16   Doreene Nest, NP  meloxicam (MOBIC) 7.5 MG tablet Take 1 tablet (7.5 mg total) by mouth daily. 01/16/18   Cathie Hoops, [place] V, PA-C  triamcinolone cream (KENALOG) 0.1 % Apply 1 application topically 2 (two) times daily. 07/04/16   Doreene Nest, NP    Family History Family History  Problem Relation Age of Onset  . Hypertension Mother   . Diabetes Mother     Social History Social History   Tobacco Use  . Smoking status: Former Games developer  . Tobacco comment: Quit 3 yeaars ago  Substance Use Topics  . Alcohol use: No    Alcohol/week: 0.0 oz  . Drug use: No    Types: Marijuana     Allergies   Penicillins and  Vantin [cefpodoxime]   Review of Systems Review of Systems  Reason unable to perform ROS: See HPI as above.     Physical Exam Triage Vital Signs ED Triage Vitals  Enc Vitals Group     BP 01/16/18 1931 (!) 137/97     Pulse Rate 01/16/18 1931 73     Resp 01/16/18 1931 18     Temp 01/16/18 1931 98 F (36.7 C)     Temp Source 01/16/18 1931 Oral     SpO2 01/16/18 1931 100 %     Weight --      Height --      Head Circumference --      Peak Flow --      Pain Score 01/16/18 1929 5     Pain Loc --      Pain Edu? --      Excl. in GC? --    No data found.  Updated Vital Signs BP (!) 137/97 (BP Location: Left Arm) Comment (BP Location): large cuff  Pulse 73   Temp 98 F (36.7 C) (Oral)   Resp 18   SpO2 100%   Physical Exam  Constitutional: He is oriented to person, place, and time. He appears well-developed and well-nourished. No distress.  HENT:  Head: Normocephalic and atraumatic.  Eyes: Pupils are  equal, round, and reactive to light. Conjunctivae are normal.  Musculoskeletal:  Swelling to the left lateral ankle.  No contusion, erythema, increased warmth.  Tenderness to palpation of the dorsal and lateral left ankle.  No obvious tenderness to palpation of medial ankle.  Mild tenderness to palpation of third distal MTP.  Range of motion strength deferred.  Strength deferred.  Sensation intact ankle bilaterally.  Pedal pulse 2+ and equal bilaterally.  Cap refill less than 2 seconds.  Neurological: He is alert and oriented to person, place, and time.     UC Treatments / Results  Labs (all labs ordered are listed, but only abnormal results are displayed) Labs Reviewed - No data to display  EKG None  Radiology Dg Ankle Complete Left  Result Date: 01/16/2018 CLINICAL DATA:  Left ankle pain since a twisting injury playing basketball yesterday. Initial encounter. EXAM: LEFT ANKLE COMPLETE - 3+ VIEW COMPARISON:  None. FINDINGS: Lateral soft tissue swelling is seen. Well  corticated bone fragment off the lateral malleolus is most consistent with old trauma or an accessory ossicle. No acute fracture is identified. No tibiotalar joint effusion. IMPRESSION: Lateral soft tissue swelling without acute bony or joint abnormality. Electronically Signed   By: Drusilla Kannerhomas  Dalessio M.D.   On: 01/16/2018 19:59    Procedures Procedures (including critical care time)  Medications Ordered in UC Medications - No data to display  Initial Impression / Assessment and Plan / UC Course  I have reviewed the triage vital signs and the nursing notes.  Pertinent labs & imaging results that were available during my care of the patient were reviewed by me and considered in my medical decision making (see chart for details).    X-ray negative for fracture or dislocation.  Start Mobic.  Ice compress, elevation, Ace wrap during activity.  Crutches provided for symptomatic relief.  Return precautions given.  Patient expresses understanding and agrees to plan.  Final Clinical Impressions(s) / UC Diagnoses   Final diagnoses:  Injury of left ankle, initial encounter    ED Prescriptions    Medication Sig Dispense Auth. Provider   meloxicam (MOBIC) 7.5 MG tablet Take 1 tablet (7.5 mg total) by mouth daily. 15 tablet Threasa AlphaYu, [place] Lanagan V, PA-C        Kyshaun Barnette V, [place]PA-C 01/16/18 2024

## 2018-01-16 NOTE — Discharge Instructions (Signed)
X-ray negative for fracture dislocation.  Start Mobic as directed.  Ice compress, elevation, Ace wrap during activity.  Crutches as needed for pain.  Follow-up with PCP/orthopedics if symptoms not improving.

## 2018-01-16 NOTE — ED Triage Notes (Signed)
Left ankle pain after injuring ankle playing basketball.  Patient landed on left ankle awkwardly.  Patient can wiggle toes

## 2018-02-27 ENCOUNTER — Telehealth: Payer: Self-pay | Admitting: Primary Care

## 2018-02-27 ENCOUNTER — Telehealth: Payer: Self-pay | Admitting: Physician Assistant

## 2018-02-27 NOTE — Telephone Encounter (Unsigned)
Copied from CRM [id number]. Topic: Quick Communication - Rx Refill/Question >> Feb 27, 2018  5:00 PM Raquel SarnaHayes, Teresa G wrote: lisinopril (PRINIVIL,ZESTRIL) 10 MG tablet  Needing refills  [id number] [place] [address] [facility] [address]  [place] [id number] Phone: [phone] Fax: [phone]

## 2018-02-27 NOTE — Telephone Encounter (Unsigned)
Copied from CRM #[id number]. Topic: Quick Communication - Rx Refill/Question °>> Feb 27, 2018  5:00 PM Hayes, Teresa G wrote: °lisinopril (PRINIVIL,ZESTRIL) 10 MG tablet ° °Needing refills ° °[id number] - [place], [place] - [address] °[address] [place] [address] °Phone: [phone] Fax: [phone] ° ° °

## 2018-02-28 NOTE — Telephone Encounter (Signed)
Lvm asking patient to call to schedule

## 2018-02-28 NOTE — Telephone Encounter (Signed)
Pt calling regarding refill of Lisinopril. Pt advised that he would need to be scheduled for annual visit in order to continue to receive refills. Last CPE 07/18/16. Pt states he will be going back to [place] on 8/22 for school, no appt availability for physical available before going back to school. Attempted to schedule pt for CPE at a later timebut pt stated that he needed to was unable to stay on the phone at this time due to being at work. Advised pt to call office back when he is available so that appt could be scheduled.

## 2018-02-28 NOTE — Telephone Encounter (Signed)
Yes, patient will need to be seen prior to refill. Please fit him in ANY slot for CPE at his convenience.  Thanks.

## 2018-03-09 ENCOUNTER — Ambulatory Visit (INDEPENDENT_AMBULATORY_CARE_PROVIDER_SITE_OTHER): Payer: Self-pay | Admitting: Primary Care

## 2018-03-09 ENCOUNTER — Encounter: Payer: Self-pay | Admitting: Primary Care

## 2018-03-09 VITALS — BP 146/94 | HR 58 | Temp 97.9°F | Ht 70.0 in | Wt 221.8 lb

## 2018-03-09 DIAGNOSIS — Z0000 Encounter for general adult medical examination without abnormal findings: Secondary | ICD-10-CM

## 2018-03-09 DIAGNOSIS — J029 Acute pharyngitis, unspecified: Secondary | ICD-10-CM

## 2018-03-09 DIAGNOSIS — I10 Essential (primary) hypertension: Secondary | ICD-10-CM

## 2018-03-09 LAB — COMPREHENSIVE METABOLIC PANEL
ALBUMIN: 4.7 g/dL (ref 3.5–5.2)
ALK PHOS: 100 U/L (ref 39–117)
ALT: 36 U/L (ref 0–53)
AST: 25 U/L (ref 0–37)
BILIRUBIN TOTAL: 0.7 mg/dL (ref 0.2–1.2)
BUN: 14 mg/dL (ref 6–23)
CALCIUM: 9.9 mg/dL (ref 8.4–10.5)
CHLORIDE: 102 meq/L (ref 96–112)
CO2: 30 mEq/L (ref 19–32)
CREATININE: 1.14 mg/dL (ref 0.40–1.50)
GFR: 82.31 mL/min (ref >60.00)
Glucose, Bld: 100 mg/dL — ABNORMAL HIGH (ref 70–99)
Potassium: 4.4 mEq/L (ref 3.5–5.1)
Sodium: 139 mEq/L (ref 135–145)
TOTAL PROTEIN: 7.7 g/dL (ref 6.0–8.3)

## 2018-03-09 LAB — LIPID PANEL
CHOLESTEROL: 161 mg/dL (ref 0–200)
HDL: 31.6 mg/dL — ABNORMAL LOW (ref >39.00)
NonHDL: 129.2
Total CHOL/HDL Ratio: 5
Triglycerides: 301 mg/dL — ABNORMAL HIGH (ref 0.0–149.0)
VLDL: 60.2 mg/dL — ABNORMAL HIGH (ref 0.0–40.0)

## 2018-03-09 LAB — LDL CHOLESTEROL, DIRECT: Direct LDL: 89 mg/dL

## 2018-03-09 LAB — POCT RAPID STREP A (OFFICE): Rapid Strep A Screen: NEGATIVE

## 2018-03-09 MED ORDER — LISINOPRIL 10 MG PO TABS: 10.0000 mg | ORAL_TABLET | Freq: Every day | ORAL | 0 refills | Status: DC

## 2018-03-09 NOTE — Progress Notes (Signed)
Subjective:    Patient ID: Kevin Shields, male    DOB: 17-Jul-1991, 27 y.o.   MRN: [id number]  HPI  Mr. Fish is a 27 year old male who presents today for complete physical.  He ran out of his lisinopril about one year ago. He's not checking his BP regularly. Last BP check was one week ago at [facility] with a reading of 140/? He denies chest pain, headaches, dizziness.   Immunizations: -Tetanus: Completed in 2017 -Influenza: Did not complete last season   Diet: He endorses a fair diet Breakfast: Skips Lunch: Grilled chicken, pasta salad, fast food Dinner: Rice, chicken, beef, some vegetables  Snacks: Occasionally ice cream, chips  Desserts: 2-3 times weekly  Beverages: Water, Gatorade, occasional soda and sweet tea  Exercise: He's exercising at the gyn twice weekly, 1-2 hours at a time. Eye exam: Completed years ago  Dental exam: No recent exam  BP Readings from Last 3 Encounters:  03/09/18 (!) 146/94  01/16/18 (!) 137/97  07/18/16 (!) 140/92     Review of Systems  Constitutional: Negative for fever and unexpected weight change.  HENT: Positive for sore throat. Negative for rhinorrhea.        Sore throat x 3 days, no fevers  Respiratory: Negative for cough and shortness of breath.   Cardiovascular: Negative for chest pain.  Gastrointestinal: Negative for constipation and diarrhea.  Genitourinary: Negative for difficulty urinating.  Musculoskeletal: Negative for arthralgias and myalgias.  Skin: Negative for rash.  Allergic/Immunologic: Negative for environmental allergies.  Neurological: Negative for dizziness, numbness and headaches.  Psychiatric/Behavioral: The patient is not nervous/anxious.        Past Medical History:  Diagnosis Date  . Elevated blood pressure reading      Social History   Socioeconomic History  . Marital status: Single    Spouse name: Not on file  . Number of children: Not on file  . Years of education: Not on file  . Highest education  level: Not on file  Occupational History  . Not on file  Social Needs  . Financial resource strain: Not on file  . Food insecurity:    Worry: Not on file    Inability: Not on file  . Transportation needs:    Medical: Not on file    Non-medical: Not on file  Tobacco Use  . Smoking status: Former Games developer  . Smokeless tobacco: Never Used  . Tobacco comment: Quit 3 yeaars ago  Substance and Sexual Activity  . Alcohol use: No    Alcohol/week: 0.0 standard drinks  . Drug use: No    Types: Marijuana  . Sexual activity: Not on file  Lifestyle  . Physical activity:    Days per week: Not on file    Minutes per session: Not on file  . Stress: Not on file  Relationships  . Social connections:    Talks on phone: Not on file    Gets together: Not on file    Attends religious service: Not on file    Active member of club or organization: Not on file    Attends meetings of clubs or organizations: Not on file    Relationship status: Not on file  . Intimate partner violence:    Fear of current or ex partner: Not on file    Emotionally abused: Not on file    Physically abused: Not on file    Forced sexual activity: Not on file  Other Topics Concern  . Not on  file  Social History Narrative   Single.   No children.   Works in Holiday representativeconstruction.   Enjoys spending time outdoors, baseball, basketball.    No past surgical history on file.  Family History  Problem Relation Age of Onset  . Hypertension Mother   . Diabetes Mother     Allergies  Allergen Reactions  . Penicillins Hives  . Vantin [Cefpodoxime]     Current Outpatient Medications on File Prior to Visit  Medication Sig Dispense Refill  . lisinopril (PRINIVIL,ZESTRIL) 10 MG tablet Take 1 tablet (10 mg total) by mouth daily. (Patient taking differently: Take 10 mg by mouth daily. Non-compliant) 90 tablet 1  . triamcinolone cream (KENALOG) 0.1 % Apply 1 application topically 2 (two) times daily. 30 g 0   No current  facility-administered medications on file prior to visit.     BP (!) 146/94   Pulse (!) 58   Temp 97.9 F (36.6 C) (Oral)   Ht 5\' 10"  (1.778 m)   Wt 221 lb 12 oz (100.6 kg)   SpO2 98%   BMI 31.82 kg/m    Objective:   Physical Exam  Constitutional: He is oriented to person, place, and time. He appears well-nourished.  HENT:  Mouth/Throat: Posterior oropharyngeal erythema present. No oropharyngeal exudate.  Eyes: Pupils are equal, round, and reactive to light. EOM are normal.  Neck: Neck supple. No thyromegaly present.  Cardiovascular: Normal rate and regular rhythm.  Respiratory: Effort normal and breath sounds normal.  GI: Soft. Bowel sounds are normal. There is no tenderness.  Musculoskeletal: Normal range of motion.  Neurological: He is alert and oriented to person, place, and time.  Skin: Skin is warm and dry.  Psychiatric: He has a normal mood and affect.           Assessment & Plan:

## 2018-03-09 NOTE — Addendum Note (Signed)
Addended by: Tawnya CrookSAMBATH, Corie Vavra on: 03/09/2018 03:56 PM   Modules accepted: Orders

## 2018-03-09 NOTE — Patient Instructions (Signed)
Resume lisinopril 10 mg tablets for blood pressure. Take 1 tablet by mouth once daily.  Stop by the lab prior to leaving today. I will notify you of your results once received.   Continue exercising. You should be getting 150 minutes of moderate intensity exercise weekly.  Continue to work on a healthy diet.   Ensure you are consuming 64 ounces of water daily.  Schedule a follow up visit in 2 weeks for blood pressure check.  It was a pleasure to see you today!   Preventive Care 18-39 Years, Male Preventive care refers to lifestyle choices and visits with your health care provider that can promote health and wellness. What does preventive care include?  A yearly physical exam. This is also called an annual well check.  Dental exams once or twice a year.  Routine eye exams. Ask your health care provider how often you should have your eyes checked.  Personal lifestyle choices, including: ? Daily care of your teeth and gums. ? Regular physical activity. ? Eating a healthy diet. ? Avoiding tobacco and drug use. ? Limiting alcohol use. ? Practicing safe sex. What happens during an annual well check? The services and screenings done by your health care provider during your annual well check will depend on your age, overall health, lifestyle risk factors, and family history of disease. Counseling Your health care provider may ask you questions about your:  Alcohol use.  Tobacco use.  Drug use.  Emotional well-being.  Home and relationship well-being.  Sexual activity.  Eating habits.  Work and work Statistician.  Screening You may have the following tests or measurements:  Height, weight, and BMI.  Blood pressure.  Lipid and cholesterol levels. These may be checked every 5 years starting at age 51.  Diabetes screening. This is done by checking your blood sugar (glucose) after you have not eaten for a while (fasting).  Skin check.  Hepatitis C blood  test.  Hepatitis B blood test.  Sexually transmitted disease (STD) testing.  Discuss your test results, treatment options, and if necessary, the need for more tests with your health care provider. Vaccines Your health care provider may recommend certain vaccines, such as:  Influenza vaccine. This is recommended every year.  Tetanus, diphtheria, and acellular pertussis (Tdap, Td) vaccine. You may need a Td booster every 10 years.  Varicella vaccine. You may need this if you have not been vaccinated.  HPV vaccine. If you are 60 or younger, you may need three doses over 6 months.  Measles, mumps, and rubella (MMR) vaccine. You may need at least one dose of MMR.You may also need a second dose.  Pneumococcal 13-valent conjugate (PCV13) vaccine. You may need this if you have certain conditions and have not been vaccinated.  Pneumococcal polysaccharide (PPSV23) vaccine. You may need one or two doses if you smoke cigarettes or if you have certain conditions.  Meningococcal vaccine. One dose is recommended if you are age 27-21 years and a first-year college student living in a residence hall, or if you have one of several medical conditions. You may also need additional booster doses.  Hepatitis A vaccine. You may need this if you have certain conditions or if you travel or work in places where you may be exposed to hepatitis A.  Hepatitis B vaccine. You may need this if you have certain conditions or if you travel or work in places where you may be exposed to hepatitis B.  Haemophilus influenzae type b (Hib) vaccine.  You may need this if you have certain risk factors.  Talk to your health care provider about which screenings and vaccines you need and how often you need them. This information is not intended to replace advice given to you by your health care provider. Make sure you discuss any questions you have with your health care provider. Document Released: 09/13/2001 Document Revised:  04/06/2016 Document Reviewed: 05/19/2015 Elsevier Interactive Patient Education  [employer].

## 2018-03-09 NOTE — Assessment & Plan Note (Signed)
Out of meds for at least one year, BP above goal in the office today. Discussed importance of compliance, refill sent to pharmacy. Will plan to see him back in 2 weeks for BP check and BMP.

## 2018-03-09 NOTE — Assessment & Plan Note (Addendum)
Immunizations UTD. Recommended regular exercise and healthy diet. Exam with mild erythema, rapid strep negative. Labs pending. Follow up in 1 year for CPE.

## 2018-03-16 ENCOUNTER — Ambulatory Visit: Payer: Self-pay | Admitting: Primary Care

## 2018-03-16 VITALS — BP 130/84 | HR 56 | Temp 98.0°F | Wt 219.0 lb

## 2018-03-16 DIAGNOSIS — I10 Essential (primary) hypertension: Secondary | ICD-10-CM

## 2018-03-16 LAB — BASIC METABOLIC PANEL
BUN: 16 mg/dL (ref 6–23)
CALCIUM: 9.7 mg/dL (ref 8.4–10.5)
CO2: 30 meq/L (ref 19–32)
CREATININE: 1.16 mg/dL (ref 0.40–1.50)
Chloride: 103 mEq/L (ref 96–112)
GFR: 80.67 mL/min (ref >60.00)
Glucose, Bld: 100 mg/dL — ABNORMAL HIGH (ref 70–99)
Potassium: 4.3 mEq/L (ref 3.5–5.1)
Sodium: 138 mEq/L (ref 135–145)

## 2018-03-16 NOTE — Patient Instructions (Signed)
Stop by the lab prior to leaving today. I will notify you of your results once received.   Make sure to transfer your [facility]opril [facility] a local [facility] in [place].  We'll see you in 1 year or sooner if needed.  It was a pleasure to see you today!

## 2018-03-16 NOTE — Progress Notes (Signed)
Subjective:    Patient ID: Kevin Shields, male    DOB: 01/03/1991, 27 y.o.   MRN: [id number]  HPI  Kevin Shields is a 27 year old male who presents today for follow up of hypertension.  He was last evaluated one week ago for CPE, noted to have continued elevated blood pressure readings. He had previously been on lisinopril but ran out one year ago. He was re-initiated on lisinopril at 10 mg during his last visit and is here today for follow up.  BP Readings from Last 3 Encounters:  03/16/18 130/84  03/09/18 (!) 146/94  01/16/18 (!) 137/97   Since his last visit he's not been checking his BP. He denies dizziness, cough, shortness of breath.  He took his lisinopril 20 minutes prior to arrival.   Review of Systems  Respiratory: Negative for cough and shortness of breath.   Cardiovascular: Negative for chest pain.  Neurological: Negative for dizziness and headaches.       Past Medical History:  Diagnosis Date  . Elevated blood pressure reading      Social History   Socioeconomic History  . Marital status: Single    Spouse name: Not on file  . Number of children: Not on file  . Years of education: Not on file  . Highest education level: Not on file  Occupational History  . Not on file  Social Needs  . Financial resource strain: Not on file  . Food insecurity:    Worry: Not on file    Inability: Not on file  . Transportation needs:    Medical: Not on file    Non-medical: Not on file  Tobacco Use  . Smoking status: Former Games developermoker  . Smokeless tobacco: Never Used  . Tobacco comment: Quit 3 yeaars ago  Substance and Sexual Activity  . Alcohol use: No    Alcohol/week: 0.0 standard drinks  . Drug use: No    Types: Marijuana  . Sexual activity: Not on file  Lifestyle  . Physical activity:    Days per week: Not on file    Minutes per session: Not on file  . Stress: Not on file  Relationships  . Social connections:    Talks on phone: Not on file    Gets together: Not on  file    Attends religious service: Not on file    Active member of club or organization: Not on file    Attends meetings of clubs or organizations: Not on file    Relationship status: Not on file  . Intimate partner violence:    Fear of current or ex partner: Not on file    Emotionally abused: Not on file    Physically abused: Not on file    Forced sexual activity: Not on file  Other Topics Concern  . Not on file  Social History Narrative   Single.   No children.   Works in [employer] representative[employer].   Enjoys spending time outdoors, baseball, basketball.    No past surgical history on file.  Family History  Problem Relation Age of Onset  . Hypertension Mother   . Diabetes Mother     Allergies  Allergen Reactions  . Penicillins Hives  . Vantin [Cefpodoxime]     Current Outpatient Medications on File Prior to Visit  Medication Sig Dispense Refill  . lisinopril (PRINIVIL,ZESTRIL) 10 MG tablet Take 1 tablet (10 mg total) by mouth daily. 90 tablet 0  . triamcinolone cream (KENALOG) 0.1 % Apply 1  application topically 2 (two) times daily. 30 g 0   No current facility-administered medications on file prior to visit.     BP 130/84   Pulse (!) 56   Temp 98 F (36.7 C) (Oral)   Wt 219 lb (99.3 kg)   SpO2 99%   BMI 31.42 kg/m    Objective:   Physical Exam  Constitutional: He appears well-nourished.  Neck: Neck supple.  Cardiovascular: Normal rate and regular rhythm.  Respiratory: Effort normal and breath sounds normal.  Skin: Skin is warm and dry.           Assessment & Plan:

## 2018-03-16 NOTE — Assessment & Plan Note (Signed)
Improved since resuming lisinopril, continue at 10 mg. BMP pending today. He is moving to [place], discussed that he will need to [facility] once [facility]. He should be back to [place] in 6-12 months.

## 2018-03-19 ENCOUNTER — Encounter: Payer: Self-pay | Admitting: *Deleted

## 2018-06-01 ENCOUNTER — Other Ambulatory Visit: Payer: Self-pay | Admitting: Primary Care

## 2018-06-01 DIAGNOSIS — I10 Essential (primary) hypertension: Secondary | ICD-10-CM

## 2018-08-03 ENCOUNTER — Telehealth: Payer: Self-pay | Admitting: Primary Care

## 2018-08-03 DIAGNOSIS — I10 Essential (primary) hypertension: Secondary | ICD-10-CM

## 2018-08-03 MED ORDER — LISINOPRIL 10 MG PO TABS: 10.0000 mg | ORAL_TABLET | Freq: Every day | ORAL | 1 refills | Status: DC

## 2018-08-03 NOTE — Telephone Encounter (Signed)
Pt is requesting a refill on the Lisinopril. He is currently in [place] for school. Pt is [facility] [address], [place].

## 2018-08-03 NOTE — Telephone Encounter (Signed)
Last prescribed on 06/01/2018 #90  with 0 refills. Last office visit on  03/16/2018   . No future appointment.

## 2018-08-03 NOTE — Telephone Encounter (Signed)
Noted.  Refill sent to pharmacy. 

## 2019-02-15 ENCOUNTER — Telehealth: Payer: Self-pay | Admitting: Primary Care

## 2019-02-15 DIAGNOSIS — I10 Essential (primary) hypertension: Secondary | ICD-10-CM

## 2019-02-15 DIAGNOSIS — L309 Dermatitis, unspecified: Secondary | ICD-10-CM

## 2019-02-15 MED ORDER — TRIAMCINOLONE ACETONIDE 0.1 % EX CREA: 1.0000 "application " | TOPICAL_CREAM | Freq: Two times a day (BID) | CUTANEOUS | 0 refills | Status: DC

## 2019-02-15 MED ORDER — LISINOPRIL 10 MG PO TABS: 10.0000 mg | ORAL_TABLET | Freq: Every day | ORAL | 0 refills | Status: DC

## 2019-02-15 NOTE — Telephone Encounter (Signed)
Please currently notify patient that he is due for his annual physical in August and will need this completed before we can prescribe any further refills.  I will provide him with a 30-day supply of his lisinopril and a refill on his triamcinolone. Please schedule.

## 2019-02-15 NOTE — Telephone Encounter (Signed)
triamcinolone cream (KENALOG) 0.1 % Last prescribed on 07/04/2016  Lisinopril 10 mg Last prescribed on 08/20/2018  Last seen on 03/16/2018

## 2019-02-15 NOTE — Telephone Encounter (Signed)
Pt called requesting a refill on lisinopril and triamcinolone cream to be sent to [facility] on [address] in [place]. Pt can be reached at [phone]

## 2019-02-18 NOTE — Telephone Encounter (Signed)
Lvm asking patient to call office to schedule.

## 2019-02-20 ENCOUNTER — Encounter: Payer: Self-pay | Admitting: Primary Care

## 2019-02-20 NOTE — Telephone Encounter (Signed)
Attempted to reach patient again. No answer. Lvm asking him to call office. Mailed letter.

## 2019-02-25 NOTE — Telephone Encounter (Signed)
Appointment 8/12/rbh

## 2019-03-13 ENCOUNTER — Encounter: Payer: Self-pay | Admitting: Primary Care

## 2019-03-13 DIAGNOSIS — Z0289 Encounter for other administrative examinations: Secondary | ICD-10-CM

## 2019-10-05 IMAGING — DX DG ANKLE COMPLETE 3+V*L*
3 series · 3 of 3 positions shown · non-contrast
Comparison: None.

CLINICAL DATA: Left ankle pain since a twisting injury playing
basketball yesterday. Initial encounter.

EXAM:
LEFT ANKLE COMPLETE - 3+ VIEW

[ankle ap]
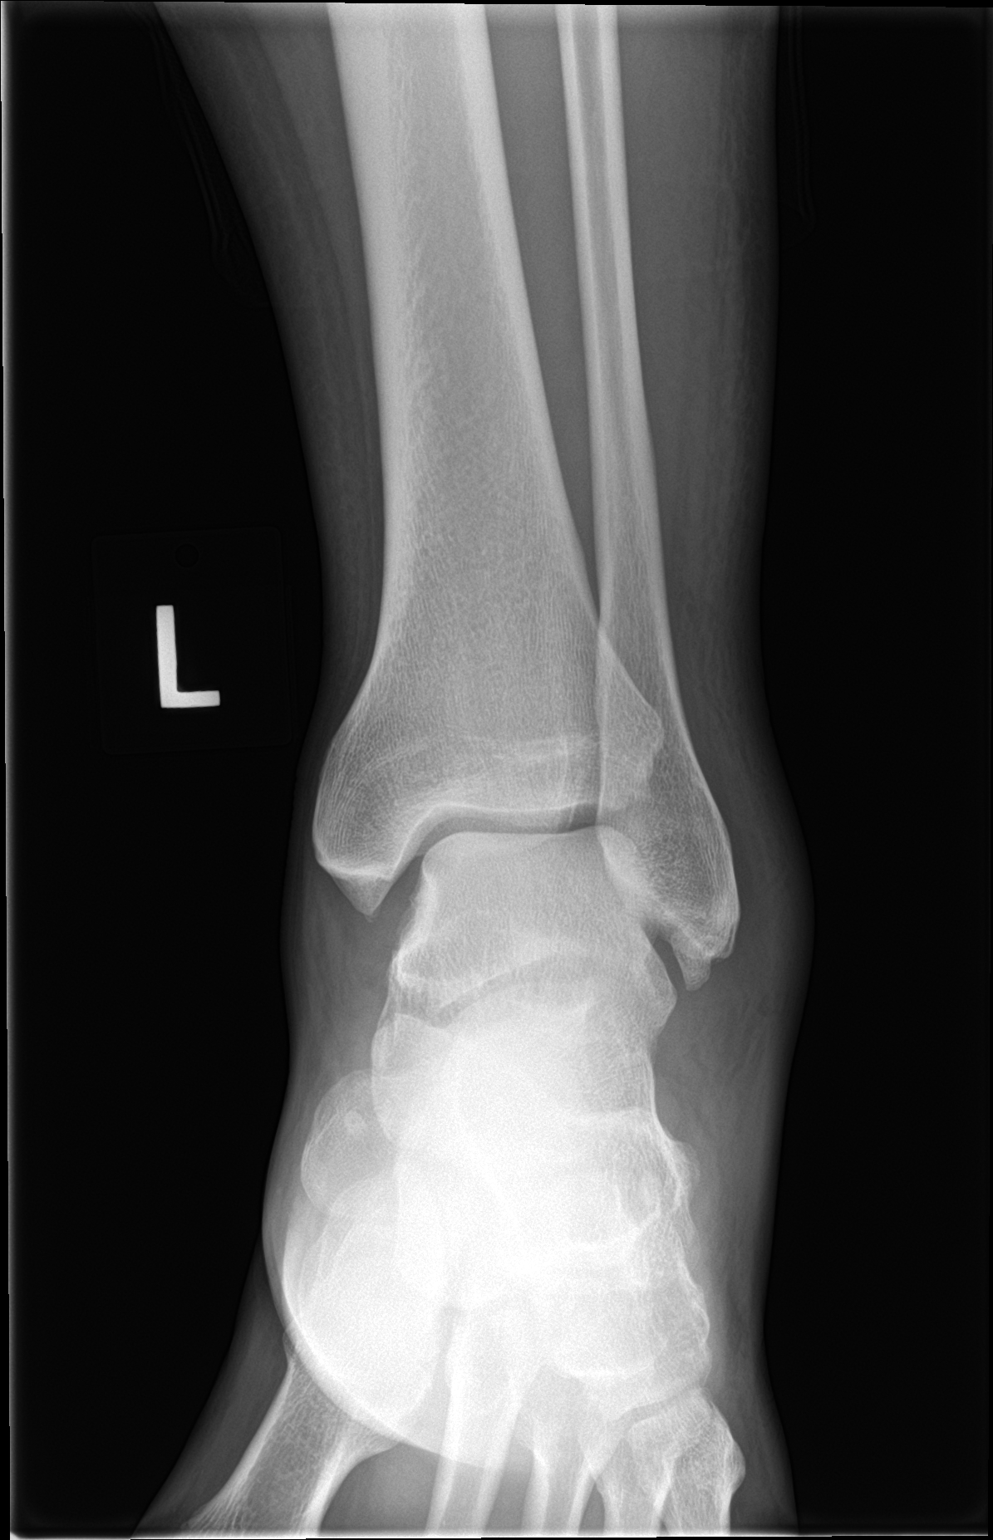

[ankle obl]
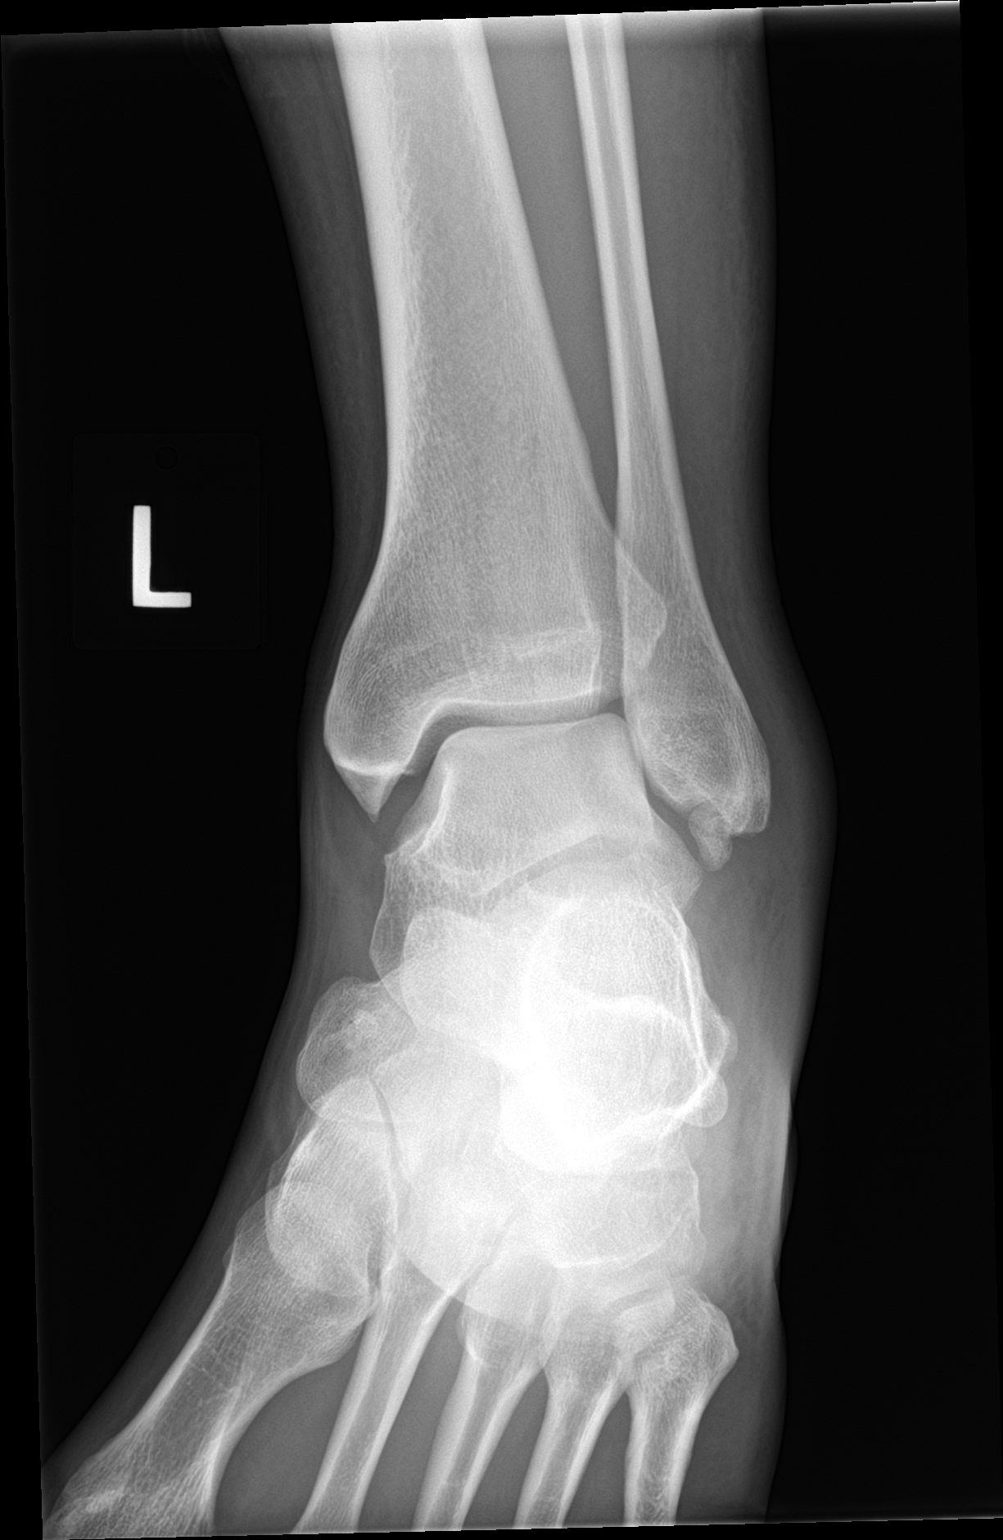

[ankle lat]
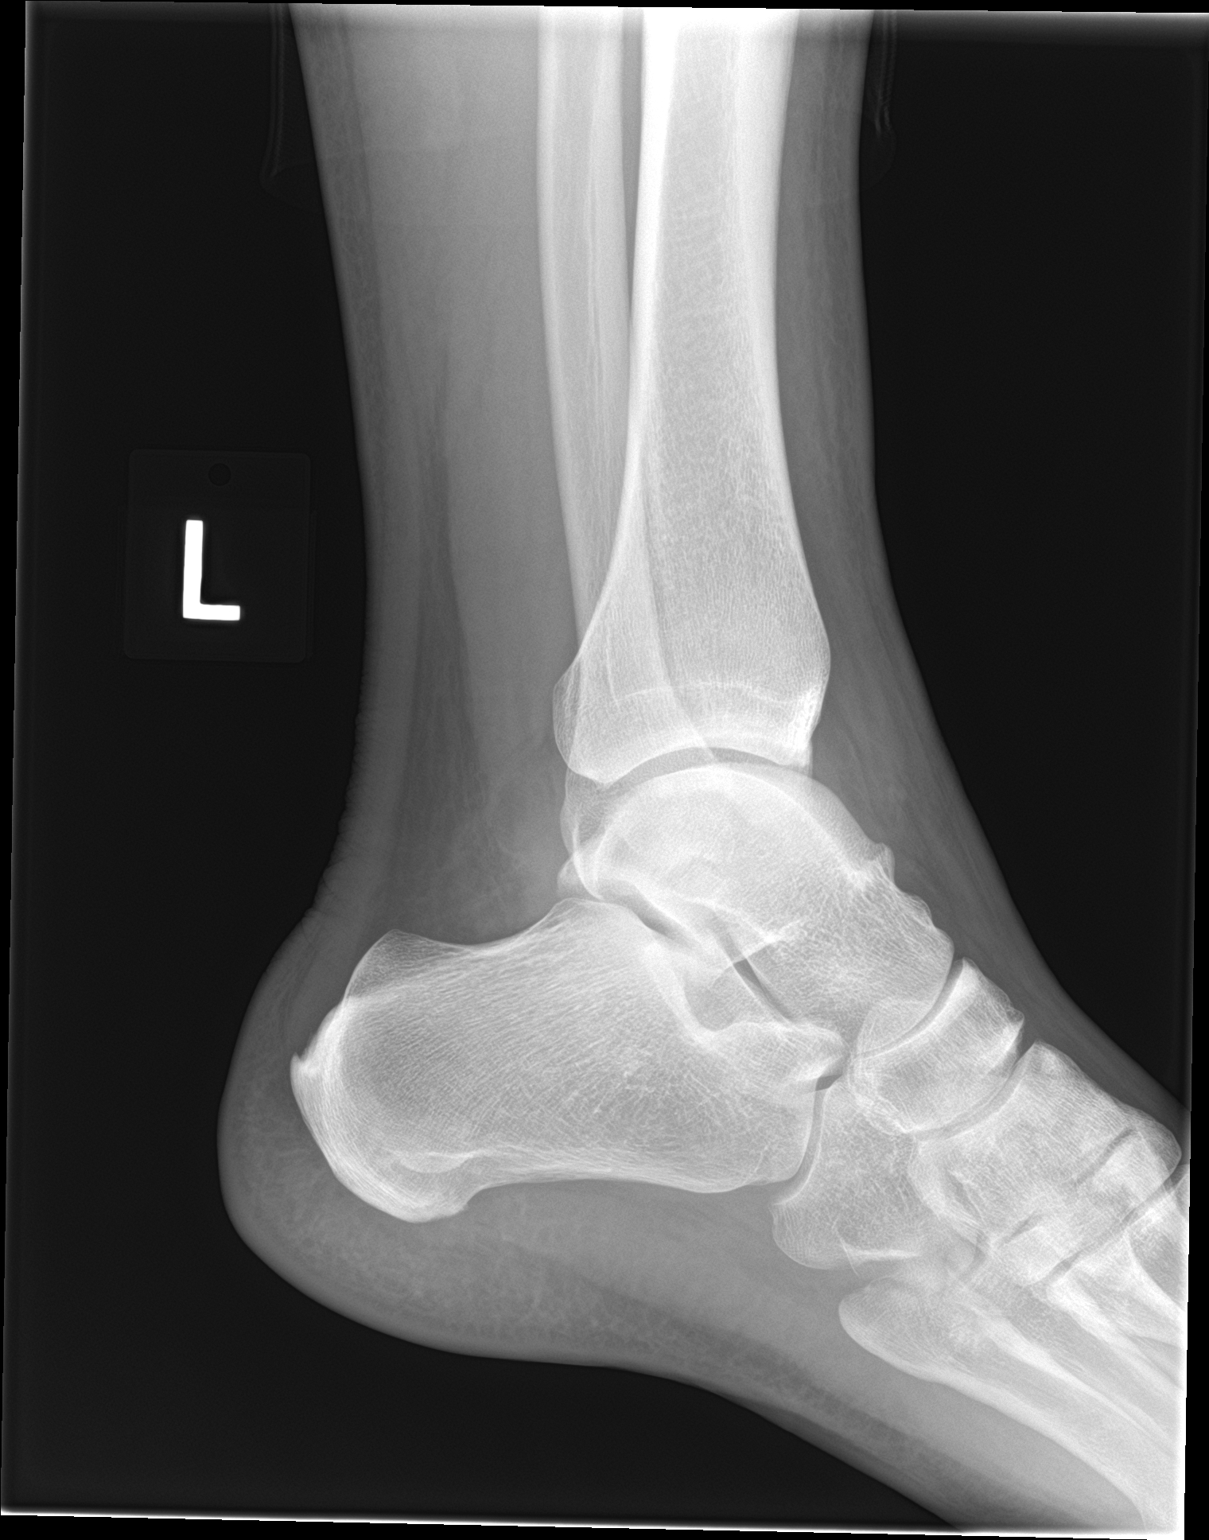

[3 of 3 positions shown; findings below may reference images not displayed]

FINDINGS: Lateral soft tissue swelling is seen. Well corticated bone fragment
off the lateral malleolus is most consistent with old trauma or an
accessory ossicle. No acute fracture is identified. No tibiotalar
joint effusion.
IMPRESSION: Lateral soft tissue swelling without acute bony or joint
abnormality.

## 2021-04-26 ENCOUNTER — Emergency Department (HOSPITAL_BASED_OUTPATIENT_CLINIC_OR_DEPARTMENT_OTHER)
Admission: EM | Admit: 2021-04-26 | Discharge: 2021-04-26 | Disposition: A | Discharge status: Home / Self Care | Payer: Self-pay | Attending: Emergency Medicine | Admitting: Emergency Medicine

## 2021-04-26 ENCOUNTER — Encounter (HOSPITAL_BASED_OUTPATIENT_CLINIC_OR_DEPARTMENT_OTHER): Payer: Self-pay | Admitting: *Deleted

## 2021-04-26 ENCOUNTER — Other Ambulatory Visit: Payer: Self-pay

## 2021-04-26 DIAGNOSIS — Z87891 Personal history of nicotine dependence: Secondary | ICD-10-CM | POA: Insufficient documentation

## 2021-04-26 DIAGNOSIS — Z79899 Other long term (current) drug therapy: Secondary | ICD-10-CM | POA: Insufficient documentation

## 2021-04-26 DIAGNOSIS — L237 Allergic contact dermatitis due to plants, except food: Secondary | ICD-10-CM | POA: Insufficient documentation

## 2021-04-26 DIAGNOSIS — I10 Essential (primary) hypertension: Secondary | ICD-10-CM | POA: Insufficient documentation

## 2021-04-26 MED ORDER — TRIAMCINOLONE ACETONIDE 0.1 % EX CREA: 1.0000 "application " | TOPICAL_CREAM | Freq: Two times a day (BID) | CUTANEOUS | 0 refills | Status: AC

## 2021-04-26 NOTE — ED Triage Notes (Addendum)
Red, elevated and itching rashes to bilateral upper arms and he think it is either poison ivy or poison oaks.  Calamine applied with no relief.  He is truing to clear some bushes and vines on Thursday and Friday.

## 2021-04-26 NOTE — Discharge Instructions (Addendum)
You have been sent home with a prescription for a steroid cream.  Please take this as prescribed.  May continue the Benadryl spray for symptom management.  If your symptoms do not improve, please return to an urgent care or primary care office or emergency department for further management.

## 2021-04-26 NOTE — ED Provider Notes (Signed)
He says on Friday [facility] EMERGENCY DEPT Provider Note   CSN: [id number] Arrival date & time: 04/26/21  1048     History Chief Complaint  Patient presents with   Rash    Kevin Shields is a 30 y.o. male.  Patient presents the chief complaint of rash to bilateral forearms.  They that he was working in an area with a lot of poison ivy.  He developed symptoms shortly after with redness of bilateral forearms.  Associated itching.  Symptoms have gradually worsened since then.  He has blisters that have popped up in those areas.  He also has noticed a small patch on his right lower abdomen.  He has been using calamine lotion and Benadryl spray with no relief of symptoms.  His brother who was working in the yard with him also developed similar symptoms.  Denies any fever, chills, difficulty breathing.   Rash Associated symptoms: no abdominal pain, no diarrhea, no fever, no headaches, no nausea, no shortness of breath and not vomiting       Past Medical History:  Diagnosis Date   Elevated blood pressure reading     Patient Active Problem List   Diagnosis Date Noted   Preventative health care 07/18/2016   Essential hypertension 02/15/2016   Eczema 02/15/2016    History reviewed. No pertinent surgical history.     Family History  Problem Relation Age of Onset   Hypertension Mother    Diabetes Mother     Social History   Tobacco Use   Smoking status: Former    Types: Cigarettes   Smokeless tobacco: Never   Tobacco comments:    Quit 3 yeaars ago  Vaping Use   Vaping Use: Never used  Substance Use Topics   Alcohol use: Not Currently   Drug use: Not Currently    Home Medications Prior to Admission medications   Medication Sig Start Date End Date Taking? Authorizing Provider  triamcinolone cream (KENALOG) 0.1 % Apply 1 application topically 2 (two) times daily. 04/26/21  Yes Sitara Cashwell, Finis Bud, PA-C  lisinopril (ZESTRIL) 10 MG tablet Take 1 tablet (10 mg  total) by mouth daily. For blood pressure. 02/15/19   Doreene Nest, NP    Allergies    Penicillins and Vantin [cefpodoxime]  Review of Systems   Review of Systems  Constitutional:  Negative for chills and fever.  HENT:  Negative for congestion and rhinorrhea.   Eyes:  Negative for visual disturbance.  Respiratory:  Negative for cough, chest tightness and shortness of breath.   Cardiovascular:  Negative for chest pain, palpitations and leg swelling.  Gastrointestinal:  Negative for abdominal pain, constipation, diarrhea, nausea and vomiting.  Genitourinary:  Negative for difficulty urinating.  Musculoskeletal:  Negative for back pain.  Skin:  Positive for rash. Negative for wound.  Neurological:  Negative for dizziness, syncope, weakness, light-headedness and headaches.  All other systems reviewed and are negative.  Physical Exam Updated Vital Signs BP (!) 158/103 (BP Location: Right Arm)   Pulse 95   Temp 98.5 F (36.9 C)   Resp 16   Ht 5\' 11"  (1.803 m)   Wt 86.2 kg   SpO2 97%   BMI 26.50 kg/m   Physical Exam Vitals and nursing note reviewed.  Constitutional:      General: He is not in acute distress.    Appearance: Normal appearance. He is not ill-appearing, toxic-appearing or diaphoretic.  HENT:     Head: Normocephalic and atraumatic.  Eyes:  General: No scleral icterus.       Right eye: No discharge.        Left eye: No discharge.     Conjunctiva/sclera: Conjunctivae normal.  Pulmonary:     Effort: Pulmonary effort is normal. No respiratory distress.  Skin:    General: Skin is warm and dry.     Findings: Rash present.     Comments: Rash with diffuse erythema and blistering of bilateral forearms.  Small patch that looks similar appears on right lower quadrant of the abdomen.  Appears to have excoriated areas due to itching.  Photo will be attached below.  Neurological:     General: No focal deficit present.     Mental Status: He is alert and oriented to  person, place, and time.  Psychiatric:        Mood and Affect: Mood normal.        Behavior: Behavior normal.         ED Results / Procedures / Treatments   Labs (all labs ordered are listed, but only abnormal results are displayed) Labs Reviewed - No data to display  EKG None  Radiology No results found.  Procedures Procedures   Medications Ordered in ED Medications - No data to display  ED Course  I have reviewed the triage vital signs and the nursing notes.  Pertinent labs & imaging results that were available during my care of the patient were reviewed by me and considered in my medical decision making (see chart for details).    MDM Rules/Calculators/A&P                         This is a well-appearing 30 year old male who presents the emergency department with bilateral forearm rash.  He had a known exposure to poison ivy 2 days ago and the rash developed shortly after.  Rash is consistent with poison ivy.  Reviewed his vital signs he is afebrile and hemodynamically stable. Prescribed triamcinolone cream to apply topically 2 times a day.  He is urged to return if his symptoms do not improve.  Final Clinical Impression(s) / ED Diagnoses Final diagnoses:  Poison ivy dermatitis    Rx / DC Orders ED Discharge Orders          Ordered    triamcinolone cream (KENALOG) 0.1 %  2 times daily        04/26/21 1334             Otila Starn, Kathee Delton 04/26/21 1907    Cheryll Cockayne, MD 05/05/21 1714

## 2023-02-16 ENCOUNTER — Telehealth: Payer: Self-pay | Admitting: Primary Care

## 2023-02-16 NOTE — Telephone Encounter (Signed)
Yes, happy to accept him back.

## 2023-02-16 NOTE — Telephone Encounter (Signed)
Patient hasn't been seen since 2019,he would like to know if Jae Dire would still be his PCP?

## 2023-02-16 NOTE — Telephone Encounter (Signed)
LVMTCB

## 2023-02-16 NOTE — Telephone Encounter (Signed)
Patient scheduled.

## 2023-02-17 ENCOUNTER — Ambulatory Visit (INDEPENDENT_AMBULATORY_CARE_PROVIDER_SITE_OTHER)
Admission: RE | Admit: 2023-02-17 | Discharge: 2023-02-17 | Disposition: A | Discharge status: Home / Self Care | Payer: BC Managed Care – PPO | Source: Ambulatory Visit | Attending: Primary Care | Admitting: Primary Care

## 2023-02-17 ENCOUNTER — Ambulatory Visit (INDEPENDENT_AMBULATORY_CARE_PROVIDER_SITE_OTHER): Payer: BC Managed Care – PPO | Admitting: Primary Care

## 2023-02-17 ENCOUNTER — Encounter: Payer: Self-pay | Admitting: Primary Care

## 2023-02-17 VITALS — BP 110/64 | HR 80 | Temp 98.0°F | Ht 71.0 in | Wt 231.0 lb

## 2023-02-17 DIAGNOSIS — R1033 Periumbilical pain: Secondary | ICD-10-CM

## 2023-02-17 DIAGNOSIS — R52 Pain, unspecified: Secondary | ICD-10-CM

## 2023-02-17 LAB — CBC WITH DIFFERENTIAL/PLATELET
Absolute Monocytes: 1208 cells/uL — ABNORMAL HIGH (ref 200–950)
Basophils Absolute: 48 cells/uL (ref 0–200)
HCT: 42.8 % (ref 38.5–50.0)
Hemoglobin: 14.5 g/dL (ref 13.2–17.1)
Lymphs Abs: 2032 cells/uL (ref 850–3900)
RDW: 12.4 % (ref 11.0–15.0)
WBC: 8 10*3/uL (ref 3.8–10.8)

## 2023-02-17 LAB — POC COVID19 BINAXNOW: SARS Coronavirus 2 Ag: NEGATIVE

## 2023-02-17 NOTE — Progress Notes (Signed)
Subjective:    Patient ID: Kevin Shields, male    DOB: [birth year], 32 y.o.   MRN: [id number]  Fever  Associated symptoms include abdominal pain, diarrhea and headaches. Pertinent negatives include no congestion, coughing, nausea or vomiting.    Kevin Shields is a very pleasant 32 y.o. male with a history of hypertension who presents today to discuss fevers and fatigue.  He has not been seen in our office since 2019.   Symptom onset four days ago with fatigue. The following day he felt chills so he thought he had a fever. He did not check his temperature. His chills and fatigue occurred yesterday morning with body aches, lower back pain. Last night he broke out into sweats, "woke up in a puddle of water". Yesterday he had 2 episodes of diarrhea, today he had 1 episode. His appetite has been decreased so he's not eating much. He's been hydrating well with water. 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   Today he's noticed persistent left lower and left mid abdominal pain. He describes his pain like gas. Today he's feeling better than he has all week.  He denies nausea, vomiting, chills, fevers, cough, congestion. He has not tested for Covid-19 infection.    Review of Systems  Constitutional:  Positive for chills and diaphoresis.  HENT:  Negative for congestion and postnasal drip.   Respiratory:  Negative for cough.   Gastrointestinal:  Positive for abdominal pain and diarrhea. Negative for constipation, nausea and vomiting.  Neurological:  Positive for headaches.         Past Medical History:  Diagnosis Date   Elevated blood pressure reading     Social History   Socioeconomic History   Marital status: Single    Spouse name: Not on file   Number of children: Not on file   Years of education: Not on file   Highest education level: Not on file  Occupational History   Not on file  Tobacco Use   Smoking status: Former    Types: Cigarettes   Smokeless  tobacco: Never   Tobacco comments:    Quit 3 yeaars ago  Vaping Use   Vaping status: Never Used  Substance and Sexual Activity   Alcohol use: Not Currently   Drug use: Not Currently   Sexual activity: Not on file  Other Topics Concern   Not on file  Social History Narrative   Single.   No children.   Works in Holiday representative.   Enjoys spending time outdoors, baseball, basketball.   Social Determinants of Health   Financial Resource Strain: Not on file  Food Insecurity: Not on file  Transportation Needs: Not on file  Physical Activity: Not on file  Stress: Not on file  Social Connections: Not on file  Intimate Partner Violence: Not on file    History reviewed. No pertinent surgical history.  Family History  Problem Relation Age of Onset   Hypertension Mother    Diabetes Mother     Allergies  Allergen Reactions   Penicillins Hives   Vantin [Cefpodoxime]     Current Outpatient Medications on File Prior to Visit  Medication Sig Dispense Refill   triamcinolone cream (KENALOG) 0.1 % Apply 1 application topically 2 (two) times daily. 30 g 0   No current facility-administered medications on file prior to visit.    BP 110/64   Pulse 80   Temp 98 F (36.7 C) (Temporal)   Ht 5\' 11"  (1.803  m)   Wt 231 lb (104.8 kg)   SpO2 99%   BMI 32.22 kg/m  Objective:   Physical Exam Constitutional:      General: He is not in acute distress.    Appearance: He is not ill-appearing.  Cardiovascular:     Rate and Rhythm: Normal rate and regular rhythm.  Pulmonary:     Effort: Pulmonary effort is normal.     Breath sounds: Normal breath sounds. No wheezing or rales.  Abdominal:     Tenderness: There is abdominal tenderness in the periumbilical area and suprapubic area. There is no guarding.    Musculoskeletal:     Cervical back: Neck supple.  Skin:    General: Skin is warm and dry.  Neurological:     Mental Status: He is alert and oriented to person, place, and time.            Assessment & Plan:  Periumbilical abdominal pain Assessment & Plan: Unclear etiology as symptoms just began. Differentials include appendicitis, colitis, infectious stool, viral etiology. Negative Covid-19 test today.  Checking labs today including CBC with diff, CMP, lipase, mono.  Discussed abdominal imaging, particularly CT. He declines CT scan for now as symptoms are mild. He does agree to an abdominal xray which was ordered and pending.  We discussed strict ED precautions if pain doesn't improve or if pain progresses. We also discussed other symptoms of infection including a return in his chills and diaphoresis.   He will update. He appears stable for outpatient treatment.   Orders: -     CBC with Differential/Platelet -     Comprehensive metabolic panel -     Lipase -     Mononucleosis screen -     DG Abd 2 Views  Body aches -     POC COVID-19 BinaxNow        Doreene Nest, NP

## 2023-02-17 NOTE — Patient Instructions (Addendum)
Stop by the lab prior to leaving today. I will notify you of your results once received.   Go to the hospital if your stomach pain gets worse, you develop fevers, you feel worse.  It was a pleasure to see you today!

## 2023-02-17 NOTE — Assessment & Plan Note (Signed)
Unclear etiology as symptoms just began. Differentials include appendicitis, colitis, infectious stool, viral etiology. Negative Covid-19 test today.  Checking labs today including CBC with diff, CMP, lipase, mono.  Discussed abdominal imaging, particularly CT. He declines CT scan for now as symptoms are mild. He does agree to an abdominal xray which was ordered and pending.  We discussed strict ED precautions if pain doesn't improve or if pain progresses. We also discussed other symptoms of infection including a return in his chills and diaphoresis.   He will update. He appears stable for outpatient treatment.

## 2023-02-18 LAB — CBC WITH DIFFERENTIAL/PLATELET
Basophils Relative: 0.6 %
Eosinophils Absolute: 288 cells/uL (ref 15–500)
Eosinophils Relative: 3.6 %
MCH: 27.8 pg (ref 27.0–33.0)
MCHC: 33.9 g/dL (ref 32.0–36.0)
MCV: 82.1 fL (ref 80.0–100.0)
MPV: 10.6 fL (ref 7.5–12.5)
Monocytes Relative: 15.1 %
Neutro Abs: 4424 cells/uL (ref 1500–7800)
Neutrophils Relative %: 55.3 %
Platelets: 252 10*3/uL (ref 140–400)
RBC: 5.21 10*6/uL (ref 4.20–5.80)
Total Lymphocyte: 25.4 %

## 2023-02-18 LAB — COMPREHENSIVE METABOLIC PANEL
AG Ratio: 1.7 (calc) (ref 1.0–2.5)
ALT: 29 U/L (ref 9–46)
AST: 19 U/L (ref 10–40)
Albumin: 4.5 g/dL (ref 3.6–5.1)
Alkaline phosphatase (APISO): 71 U/L (ref 36–130)
BUN/Creatinine Ratio: 9 (calc) (ref 6–22)
BUN: 13 mg/dL (ref 7–25)
CO2: 27 mmol/L (ref 20–32)
Calcium: 9.8 mg/dL (ref 8.6–10.3)
Chloride: 101 mmol/L (ref 98–110)
Creat: 1.51 mg/dL — ABNORMAL HIGH (ref 0.60–1.26)
Globulin: 2.7 g/dL (calc) (ref 1.9–3.7)
Glucose, Bld: 92 mg/dL (ref 65–99)
Potassium: 4.2 mmol/L (ref 3.5–5.3)
Sodium: 139 mmol/L (ref 135–146)
Total Bilirubin: 0.5 mg/dL (ref 0.2–1.2)
Total Protein: 7.2 g/dL (ref 6.1–8.1)

## 2023-02-18 LAB — LIPASE: Lipase: 30 U/L (ref 7–60)

## 2023-02-18 LAB — MONONUCLEOSIS SCREEN: Heterophile, Mono Screen: NEGATIVE

## 2023-02-20 DIAGNOSIS — R1031 Right lower quadrant pain: Secondary | ICD-10-CM

## 2023-03-20 NOTE — Telephone Encounter (Signed)
Ashtyn, Stat CT abdomen/pelvis ordered.

## 2023-03-21 ENCOUNTER — Other Ambulatory Visit: Payer: Self-pay | Admitting: Primary Care

## 2023-03-21 ENCOUNTER — Ambulatory Visit (HOSPITAL_COMMUNITY)
Admission: RE | Admit: 2023-03-21 | Discharge: 2023-03-21 | Disposition: A | Discharge status: Home / Self Care | Payer: BC Managed Care – PPO | Source: Ambulatory Visit | Attending: Primary Care | Admitting: Primary Care

## 2023-03-21 DIAGNOSIS — R1031 Right lower quadrant pain: Secondary | ICD-10-CM

## 2023-03-21 DIAGNOSIS — K529 Noninfective gastroenteritis and colitis, unspecified: Secondary | ICD-10-CM

## 2023-03-21 MED ORDER — IOHEXOL 350 MG/ML SOLN
75.0000 mL | Freq: Once | INTRAVENOUS | Status: AC | PRN
  Administered 2023-03-21: 75 mL via INTRAVENOUS

## 2023-03-21 NOTE — Telephone Encounter (Signed)
Pt was scanned today 8/20/204 at 12pm

## 2023-03-24 ENCOUNTER — Other Ambulatory Visit (INDEPENDENT_AMBULATORY_CARE_PROVIDER_SITE_OTHER): Payer: BC Managed Care – PPO

## 2023-03-24 DIAGNOSIS — R1031 Right lower quadrant pain: Secondary | ICD-10-CM | POA: Diagnosis not present

## 2023-03-24 DIAGNOSIS — K529 Noninfective gastroenteritis and colitis, unspecified: Secondary | ICD-10-CM

## 2023-03-24 NOTE — Addendum Note (Signed)
Addended by: Vincenza Hews on: 03/24/2023 04:22 PM   Modules accepted: Orders

## 2023-03-27 LAB — CBC WITH DIFFERENTIAL/PLATELET
Absolute Monocytes: 420 {cells}/uL (ref 200–950)
Basophils Absolute: 60 {cells}/uL (ref 0–200)
Basophils Relative: 0.8 %
Eosinophils Absolute: 210 {cells}/uL (ref 15–500)
Eosinophils Relative: 2.8 %
HCT: 44.8 % (ref 38.5–50.0)
Hemoglobin: 14.8 g/dL (ref 13.2–17.1)
Lymphs Abs: 2168 {cells}/uL (ref 850–3900)
MCH: 27.2 pg (ref 27.0–33.0)
MCHC: 33 g/dL (ref 32.0–36.0)
MCV: 82.2 fL (ref 80.0–100.0)
MPV: 9.5 fL (ref 7.5–12.5)
Monocytes Relative: 5.6 %
Neutro Abs: 4643 {cells}/uL (ref 1500–7800)
Neutrophils Relative %: 61.9 %
Platelets: 346 10*3/uL (ref 140–400)
RBC: 5.45 10*6/uL (ref 4.20–5.80)
RDW: 12.9 % (ref 11.0–15.0)
Total Lymphocyte: 28.9 %
WBC: 7.5 10*3/uL (ref 3.8–10.8)

## 2023-03-27 LAB — SEDIMENTATION RATE: Sed Rate: 2 mm/h (ref 0–15)

## 2023-03-27 LAB — TIQ-MISC

## 2023-03-28 NOTE — Addendum Note (Signed)
Addended by: Vincenza Hews on: 03/28/2023 08:59 AM   Modules accepted: Orders

## 2023-03-29 LAB — GIARDIA ANTIGEN
MICRO NUMBER:: 15387691
RESULT:: NOT DETECTED
SPECIMEN QUALITY:: ADEQUATE

## 2023-03-30 LAB — GASTROINTESTINAL PATHOGEN PNL
CampyloBacter Group: NOT DETECTED
Norovirus GI/GII: NOT DETECTED
Rotavirus A: NOT DETECTED
Salmonella species: NOT DETECTED
Shiga Toxin 1: NOT DETECTED
Shiga Toxin 2: NOT DETECTED
Shigella Species: NOT DETECTED
Vibrio Group: NOT DETECTED
Yersinia enterocolitica: NOT DETECTED

## 2023-04-05 LAB — C. DIFFICILE GDH AND TOXIN A/B
GDH ANTIGEN: NOT DETECTED
MICRO NUMBER:: 15387773
SPECIMEN QUALITY:: ADEQUATE
TOXIN A AND B: NOT DETECTED

## 2023-04-05 LAB — CALPROTECTIN: Calprotectin: 27 ug/g

## 2023-06-06 ENCOUNTER — Encounter (HOSPITAL_COMMUNITY): Payer: Self-pay

## 2023-06-06 ENCOUNTER — Ambulatory Visit (HOSPITAL_COMMUNITY)
Admission: EM | Admit: 2023-06-06 | Discharge: 2023-06-06 | Disposition: A | Discharge status: Home / Self Care | Payer: BC Managed Care – PPO

## 2023-06-06 DIAGNOSIS — H6121 Impacted cerumen, right ear: Secondary | ICD-10-CM

## 2023-06-06 NOTE — ED Provider Notes (Signed)
[facility]    CSN: [id number] Arrival date & time: 06/06/23  1733      History   Chief Complaint Chief Complaint  Patient presents with   Ear Fullness    HPI Kevin Shields is a 32 y.o. male.   32 year old male who presents to urgent care with complaints of right ear fullness.  He reports for about a week he has noticed that sounds are muffled on the right side.  He denies any pain.  He has been using some over-the-counter earwax remover but it has not helped.  He denies any history of this in the past.  He has not been using anything in his ear such as earbuds or Q-tips.  He denies any fevers or chills.   Ear Fullness Pertinent negatives include no chest pain, no abdominal pain and no shortness of breath.    Past Medical History:  Diagnosis Date   Elevated blood pressure reading     Patient Active Problem List   Diagnosis Date Noted   Periumbilical abdominal pain 02/17/2023   Preventative health care 07/18/2016   Essential hypertension 02/15/2016   Eczema 02/15/2016    History reviewed. No pertinent surgical history.     Home Medications    Prior to Admission medications   Medication Sig Start Date End Date Taking? Authorizing Provider  triamcinolone cream (KENALOG) 0.1 % Apply 1 application topically 2 (two) times daily. 04/26/21   Loeffler, Finis Bud, PA-C    Family History Family History  Problem Relation Age of Onset   Hypertension Mother    Diabetes Mother     Social History Social History   Tobacco Use   Smoking status: Former    Types: Cigarettes   Smokeless tobacco: Never   Tobacco comments:    Quit 3 yeaars ago  Vaping Use   Vaping status: Never Used  Substance Use Topics   Alcohol use: Not Currently   Drug use: Not Currently     Allergies   Penicillins and Vantin [cefpodoxime]   Review of Systems Review of Systems  Constitutional:  Negative for chills and fever.  HENT:  Positive for hearing loss (Muffled hearing).  Negative for ear pain and sore throat.   Eyes:  Negative for pain and visual disturbance.  Respiratory:  Negative for cough and shortness of breath.   Cardiovascular:  Negative for chest pain and palpitations.  Gastrointestinal:  Negative for abdominal pain and vomiting.  Genitourinary:  Negative for dysuria and hematuria.  Musculoskeletal:  Negative for arthralgias and back pain.  Skin:  Negative for color change and rash.  Neurological:  Negative for seizures and syncope.  All other systems reviewed and are negative.    Physical Exam Triage Vital Signs ED Triage Vitals  Encounter Vitals Group     BP 06/06/23 1910 (!) 157/97     Systolic BP Percentile --      Diastolic BP Percentile --      Pulse Rate 06/06/23 1909 77     Resp 06/06/23 1909 16     Temp 06/06/23 1909 98.1 F (36.7 C)     Temp Source 06/06/23 1909 Oral     SpO2 06/06/23 1909 98 %     Weight --      Height --      Head Circumference --      Peak Flow --      Pain Score 06/06/23 1910 0     Pain Loc --  Pain Education --      Exclude from Growth Chart --    No data found.  Updated Vital Signs BP (!) 157/97 (BP Location: Left Arm)   Pulse 77   Temp 98.1 F (36.7 C) (Oral)   Resp 16   SpO2 98%   Visual Acuity Right Eye Distance:   Left Eye Distance:   Bilateral Distance:    Right Eye Near:   Left Eye Near:    Bilateral Near:     Physical Exam Vitals and nursing note reviewed.  Constitutional:      General: He is not in acute distress.    Appearance: He is well-developed.  HENT:     Head: Normocephalic and atraumatic.     Right Ear: There is impacted cerumen.     Left Ear: Tympanic membrane normal.  Eyes:     Conjunctiva/sclera: Conjunctivae normal.  Cardiovascular:     Rate and Rhythm: Normal rate and regular rhythm.     Heart sounds: No murmur heard. Pulmonary:     Effort: Pulmonary effort is normal. No respiratory distress.     Breath sounds: Normal breath sounds.  Abdominal:      Palpations: Abdomen is soft.     Tenderness: There is no abdominal tenderness.  Musculoskeletal:        General: No swelling.     Cervical back: Neck supple.  Skin:    General: Skin is warm and dry.     Capillary Refill: Capillary refill takes less than 2 seconds.  Neurological:     Mental Status: He is alert.  Psychiatric:        Mood and Affect: Mood normal.      UC Treatments / Results  Labs (all labs ordered are listed, but only abnormal results are displayed) Labs Reviewed - No data to display  EKG   Radiology No results found.  Procedures Procedures (including critical care time)  Medications Ordered in UC Medications - No data to display  Initial Impression / Assessment and Plan / UC Course  I have reviewed the triage vital signs and the nursing notes.  Pertinent labs & imaging results that were available during my care of the patient were reviewed by me and considered in my medical decision making (see chart for details).     Impacted cerumen of right ear   Impacted cerumen of the right ear. Ear flushed out today and now ear canal is clear. No signs of infection. Recommend avoiding using q-tips in ears. May rinse with peroxide several times weekly.   Return to urgent care or PCP if symptoms worsen or fail to resolve.    Final Clinical Impressions(s) / UC Diagnoses   Final diagnoses:  None   Discharge Instructions   None    ED Prescriptions   None    PDMP not reviewed this encounter.   Quintella Reichert 06/06/23 2004

## 2023-06-06 NOTE — ED Triage Notes (Signed)
Pt states right ear fullness and having a hard time hearing for the past 5 days.

## 2023-06-06 NOTE — Discharge Instructions (Addendum)
Impacted cerumen of the right ear. Ear flushed out today and now ear canal is clear. No signs of infection. Recommend avoiding using q-tips in ears. May rinse with peroxide several times weekly.   Return to urgent care or PCP if symptoms worsen or fail to resolve.

## 2023-06-26 ENCOUNTER — Telehealth: Payer: BC Managed Care – PPO | Admitting: Nurse Practitioner

## 2023-06-26 DIAGNOSIS — B9689 Other specified bacterial agents as the cause of diseases classified elsewhere: Secondary | ICD-10-CM | POA: Diagnosis not present

## 2023-06-26 DIAGNOSIS — J019 Acute sinusitis, unspecified: Secondary | ICD-10-CM

## 2023-07-08 MED ORDER — DOXYCYCLINE HYCLATE 100 MG PO TABS
100.0000 mg | ORAL_TABLET | Freq: Two times a day (BID) | ORAL | 0 refills | Status: AC
Start: 2023-07-08 — End: ?

## 2023-07-08 MED ORDER — IPRATROPIUM BROMIDE 0.03 % NA SOLN
2.0000 | Freq: Two times a day (BID) | NASAL | 12 refills | Status: DC
Start: 2023-07-08 — End: 2024-06-19

## 2023-07-08 NOTE — Progress Notes (Signed)

## 2023-07-08 NOTE — Progress Notes (Signed)
I have spent 5 minutes in review of e-visit questionnaire, review and updating patient chart, medical decision making and response to patient.  ° °Jerrell Mangel W Secilia Apps, NP ° °  °

## 2024-06-15 ENCOUNTER — Emergency Department (HOSPITAL_BASED_OUTPATIENT_CLINIC_OR_DEPARTMENT_OTHER)
Admission: EM | Admit: 2024-06-15 | Discharge: 2024-06-15 | Disposition: A | Discharge status: Home / Self Care | Attending: Emergency Medicine | Admitting: Emergency Medicine

## 2024-06-15 ENCOUNTER — Encounter (HOSPITAL_BASED_OUTPATIENT_CLINIC_OR_DEPARTMENT_OTHER): Payer: Self-pay

## 2024-06-15 DIAGNOSIS — R030 Elevated blood-pressure reading, without diagnosis of hypertension: Secondary | ICD-10-CM | POA: Diagnosis present

## 2024-06-15 DIAGNOSIS — I10 Essential (primary) hypertension: Secondary | ICD-10-CM | POA: Diagnosis not present

## 2024-06-15 NOTE — ED Provider Notes (Signed)
 [place] [facility] Provider Note   CSN: [id number] Arrival date & time: 06/15/24  2237     Patient presents with: Hypertension   Kevin Shields is a 33 y.o. male.   HPI     This is a 33 year old male who presents with concern for high blood pressure.  He was walking around [place] when he decided to sit down and take his blood pressure at an automated station.  He indicated that his blood pressure was high and that he needed to seek medical attention.  He called his mother who is a engineer, civil (consulting).  She took his blood pressure and verified that it was high.  He used to take blood pressure medication but I have not taken any in years.  Reports his last blood pressure check was actually normal in office.  He denies any symptoms including chest pain, shortness of breath, headache, neurologic symptoms.  He believes he used to take lisinopril .  Prior to Admission medications   Medication Sig Start Date End Date Taking? Authorizing Provider  doxycycline  (VIBRA -TABS) 100 MG tablet Take 1 tablet (100 mg total) by mouth 2 (two) times daily. 07/08/23   Fleming, Zelda W, NP  ipratropium (ATROVENT ) 0.03 % nasal spray Place 2 sprays into both nostrils every 12 (twelve) hours. 07/08/23   Fleming, Zelda W, NP  triamcinolone  cream (KENALOG ) 0.1 % Apply 1 application topically 2 (two) times daily. 04/26/21   Loeffler, Ronnald BROCKS, PA-C    Allergies: Penicillins and Vantin [cefpodoxime]    Review of Systems  Constitutional:  Negative for fever.  Respiratory:  Negative for chest tightness and shortness of breath.   Cardiovascular:  Negative for chest pain.  Neurological:  Negative for headaches.  All other systems reviewed and are negative.   Updated Vital Signs BP (!) 175/122   Pulse 90   Temp 97.9 F (36.6 C)   Resp 18   Ht 1.803 m (5' 11)   Wt 104.3 kg   SpO2 98%   BMI 32.08 kg/m   Physical Exam Vitals and nursing note reviewed.  Constitutional:       Appearance: He is well-developed. He is not ill-appearing.  HENT:     Head: Normocephalic and atraumatic.  Eyes:     Pupils: Pupils are equal, round, and reactive to light.  Cardiovascular:     Rate and Rhythm: Normal rate and regular rhythm.     Heart sounds: Normal heart sounds. No murmur heard. Pulmonary:     Effort: Pulmonary effort is normal. No respiratory distress.     Breath sounds: Normal breath sounds. No wheezing.  Abdominal:     Palpations: Abdomen is soft.     Tenderness: There is no abdominal tenderness. There is no rebound.  Musculoskeletal:     Cervical back: Neck supple.  Lymphadenopathy:     Cervical: No cervical adenopathy.  Skin:    General: Skin is warm and dry.  Neurological:     Mental Status: He is alert and oriented to person, place, and time.  Psychiatric:        Mood and Affect: Mood normal.     (all labs ordered are listed, but only abnormal results are displayed) Labs Reviewed - No data to display  EKG: None  Radiology: No results found.   Procedures   Medications Ordered in the ED - No data to display  Medical Decision Making  This patient presents to the ED for concern of hypertension, this involves an extensive number of treatment options, and is a complaint that carries with it a high risk of complications and morbidity.  I considered the following differential and admission for this acute, potentially life threatening condition.  The differential diagnosis includes essential hypertension, asymptomatic hypertension, hypertensive urgency, hypertensive emergency  MDM:    This is a 33 year old male who presents with elevated blood pressure readings.  He is nontoxic and vital signs initially notable for blood pressure 175/122.  While is in the room his blood pressure was 147/109.  He is completely asymptomatic.  He took a random reading at [employer].  Discussed with him that without additional symptoms, this  would be considered asymptomatic hypertension.  While in the long run this can be detrimental to his health, generally speaking this does not constitute emergent workup.  I did offer him lab work and reinitiating lisinopril  versus close follow-up with his primary doctor for recheck and initiation of blood pressure medications as an outpatient given that he is asymptomatic.  He has elected for this.  I recommend that he keep a blood pressure log in the meantime.  (Labs, imaging, consults)  Labs: I Ordered, and personally interpreted labs.  The pertinent results include: None  Imaging Studies ordered: I ordered imaging studies including none none I independently visualized and interpreted imaging. I agree with the radiologist interpretation  Additional history obtained from chart review.  External records from outside source obtained and reviewed including prior evaluations  Cardiac Monitoring: The patient was maintained on a cardiac monitor.  If on the cardiac monitor, I personally viewed and interpreted the cardiac monitored which showed an underlying rhythm of: Sinus  Reevaluation: After the interventions noted above, I reevaluated the patient and found that they have :stayed the same  Social Determinants of Health:  lives independently  Disposition: Discharge  Co morbidities that complicate the patient evaluation  Past Medical History:  Diagnosis Date   Elevated blood pressure reading      Medicines No orders of the defined types were placed in this encounter.   I have reviewed the patients home medicines and have made adjustments as needed  Problem List / ED Course: Problem List Items Addressed This Visit   None Visit Diagnoses       Asymptomatic hypertension    -  Primary                Final diagnoses:  Asymptomatic hypertension    ED Discharge Orders     None          Bari Charmaine FALCON, MD 06/15/24 2321

## 2024-06-15 NOTE — ED Triage Notes (Signed)
 Pt states that he was shopping in [employer] and decided to check his BP and it was high. Has not been on BP meds in several years, denies symptoms.

## 2024-06-15 NOTE — Discharge Instructions (Signed)
 You were seen today with concerns for high blood pressure.  You did not have any additional symptoms.  It is important that you call your primary doctor to have an annual check.  At that time they can do blood work and determine reinitiating blood pressure medications.  In the meantime I would keep a log of your blood pressures.  If you develop any symptoms including chest pain, shortness of breath, headache, you should be reevaluated immediately.

## 2024-06-17 NOTE — Telephone Encounter (Signed)
 Patient was seen in ED over the weekend. No symptoms has not check bp after visit. I have scheduled for follow up in office with Dr. Avelina on Wednesday. Patient not able to be seen before then due to work schedule. If any red word symptoms will be seen at ED.  Kevin Shields, CMA

## 2024-06-17 NOTE — Telephone Encounter (Signed)
 Noted

## 2024-06-19 ENCOUNTER — Ambulatory Visit: Admitting: Family Medicine

## 2024-06-19 VITALS — BP 150/108 | HR 85 | Temp 98.0°F | Ht 71.0 in | Wt 234.0 lb

## 2024-06-19 DIAGNOSIS — E66811 Obesity, class 1: Secondary | ICD-10-CM | POA: Diagnosis not present

## 2024-06-19 DIAGNOSIS — E6609 Other obesity due to excess calories: Secondary | ICD-10-CM

## 2024-06-19 DIAGNOSIS — Z6832 Body mass index (BMI) 32.0-32.9, adult: Secondary | ICD-10-CM

## 2024-06-19 DIAGNOSIS — J329 Chronic sinusitis, unspecified: Secondary | ICD-10-CM | POA: Diagnosis not present

## 2024-06-19 DIAGNOSIS — I10 Essential (primary) hypertension: Secondary | ICD-10-CM | POA: Diagnosis not present

## 2024-06-19 LAB — COMPREHENSIVE METABOLIC PANEL WITH GFR
ALT: 117 U/L — ABNORMAL HIGH (ref 0–53)
AST: 38 U/L — ABNORMAL HIGH (ref 0–37)
Albumin: 4.9 g/dL (ref 3.5–5.2)
Alkaline Phosphatase: 92 U/L (ref 39–117)
BUN: 11 mg/dL (ref 6–23)
CO2: 31 meq/L (ref 19–32)
Calcium: 9.9 mg/dL (ref 8.4–10.5)
Chloride: 99 meq/L (ref 96–112)
Creatinine, Ser: 1.14 mg/dL (ref 0.40–1.50)
GFR: 85.03 mL/min (ref >60.00)
Glucose, Bld: 96 mg/dL (ref 70–99)
Potassium: 3.8 meq/L (ref 3.5–5.1)
Sodium: 138 meq/L (ref 135–145)
Total Bilirubin: 0.7 mg/dL (ref 0.2–1.2)
Total Protein: 7.6 g/dL (ref 6.0–8.3)

## 2024-06-19 LAB — LIPID PANEL
Cholesterol: 212 mg/dL — ABNORMAL HIGH (ref 0–200)
HDL: 33 mg/dL — ABNORMAL LOW (ref >39.00)
NonHDL: 178.71
Total CHOL/HDL Ratio: 6
Triglycerides: 440 mg/dL — ABNORMAL HIGH (ref 0.0–149.0)
VLDL: 88 mg/dL — ABNORMAL HIGH (ref 0.0–40.0)

## 2024-06-19 LAB — CBC WITH DIFFERENTIAL/PLATELET
Basophils Absolute: 0.1 K/uL (ref 0.0–0.1)
Basophils Relative: 1 % (ref 0.0–3.0)
Eosinophils Absolute: 0.5 K/uL (ref 0.0–0.7)
Eosinophils Relative: 6.6 % — ABNORMAL HIGH (ref 0.0–5.0)
HCT: 47.8 % (ref 39.0–52.0)
Hemoglobin: 16.6 g/dL (ref 13.0–17.0)
Lymphocytes Relative: 30.3 % (ref 12.0–46.0)
Lymphs Abs: 2.1 K/uL (ref 0.7–4.0)
MCHC: 34.7 g/dL (ref 30.0–36.0)
MCV: 81.3 fl (ref 78.0–100.0)
Monocytes Absolute: 0.6 K/uL (ref 0.1–1.0)
Monocytes Relative: 8.2 % (ref 3.0–12.0)
Neutro Abs: 3.8 K/uL (ref 1.4–7.7)
Neutrophils Relative %: 53.9 % (ref 43.0–77.0)
Platelets: 264 K/uL (ref 150.0–400.0)
RBC: 5.88 Mil/uL — ABNORMAL HIGH (ref 4.22–5.81)
RDW: 13.6 % (ref 11.5–15.5)
WBC: 7 K/uL (ref 4.0–10.5)

## 2024-06-19 LAB — LDL CHOLESTEROL, DIRECT: Direct LDL: 119 mg/dL

## 2024-06-19 LAB — TSH: TSH: 2.04 u[IU]/mL (ref 0.35–5.50)

## 2024-06-19 MED ORDER — LISINOPRIL 10 MG PO TABS: 10.0000 mg | ORAL_TABLET | Freq: Every day | ORAL | 11 refills | Status: AC

## 2024-06-19 MED ORDER — IPRATROPIUM BROMIDE 0.03 % NA SOLN
2.0000 | Freq: Two times a day (BID) | NASAL | 12 refills | Status: AC
Start: 2024-06-19 — End: ?

## 2024-06-19 NOTE — Assessment & Plan Note (Signed)
 Chronic, no sign of infection... stop afrin.. start daily nasal saline spray/irrigation.  Start back on nasal ipratropium BID

## 2024-06-19 NOTE — Assessment & Plan Note (Addendum)
 Chronic, acute worsening.  Patient had been maintained off of blood pressure medicine for a while but blood pressure is trending up. Will check for secondary causes, endorgan damage and associated risk factors with labs and EKG.  Stop afrin as may be contributing.  Recommend weight modification, reinitiation of lisinopril  if renal function tests are normal.  Low-salt heart healthy diet and regular exercise.  Close follow-up with PCP in 2 weeks for repeat check blood pressure.

## 2024-06-19 NOTE — Progress Notes (Signed)
 Patient ID: Kevin Shields, male    DOB: 1991/04/06, 33 y.o.   MRN: [id number]  This visit was conducted in person.  BP (!) 150/108   Pulse 85   Temp 98 F (36.7 C) (Oral)   Ht 5' 11 (1.803 m)   Wt 234 lb (106.1 kg)   SpO2 96%   BMI 32.64 kg/m    CC:  Chief Complaint  Patient presents with   Hypertension    Patient being seen for blood pressure. Asking for more nasal spray due to nose getting dry.     Subjective:   HPI: Kevin Shields is a 33 y.o. male presenting on 06/19/2024 for Hypertension (Patient being seen for blood pressure. Asking for more nasal spray due to nose getting dry. )  PCP Gretta, K  Hypertension:  Patient has had previous diagnosis of hypertension but does not appear to be on a medication.  Last PCP note regarding this from 2019.  Was on lisinopril  10 mg daily.  At that time was moving to [place] . He was seen at ED on June 15, 2024 with asymptomatic high blood pressure. He reported he was walking around [place] when he decided to sit down and take his blood pressure at an automated station.  Blood pressure was elevated. No evaluation or treatment was done at that time... No labs, no EKG   Not checking BP at home.  BP Readings from Last 3 Encounters:  06/19/24 (!) 150/108  06/15/24 (!) 143/109  06/06/23 (!) 157/97    No HA. Using medication without problems or lightheadedness:  none Chest pain with exertion: none Edema:none Short of breath:none Average home BPs: Other issues: not sure if he snores.t  Strong family history of HTN.  Drinks a a lot of caffeine   Not  a lot salt.  Minimal exercise.  Using afrin recently.. using 2 times a week. Wt Readings from Last 3 Encounters:  06/19/24 234 lb (106.1 kg)  06/15/24 230 lb (104.3 kg)  02/17/23 231 lb (104.8 kg)   Body mass index is 32.64 kg/m.         Relevant past medical, surgical, family and social history reviewed and updated as indicated. Interim medical history since our last visit  reviewed. Allergies and medications reviewed and updated. Outpatient Medications Prior to Visit  Medication Sig Dispense Refill   triamcinolone  cream (KENALOG ) 0.1 % Apply 1 application topically 2 (two) times daily. 30 g 0   ipratropium (ATROVENT ) 0.03 % nasal spray Place 2 sprays into both nostrils every 12 (twelve) hours. 30 mL 12   doxycycline  (VIBRA -TABS) 100 MG tablet Take 1 tablet (100 mg total) by mouth 2 (two) times daily. 20 tablet 0   No facility-administered medications prior to visit.     Per HPI unless specifically indicated in ROS section below Review of Systems  Constitutional:  Negative for fatigue and fever.  HENT:  Negative for ear pain.   Eyes:  Negative for pain.  Respiratory:  Negative for cough and shortness of breath.   Cardiovascular:  Negative for chest pain, palpitations and leg swelling.  Gastrointestinal:  Negative for abdominal pain.  Genitourinary:  Negative for dysuria.  Musculoskeletal:  Negative for arthralgias.  Neurological:  Negative for syncope, light-headedness and headaches.  Psychiatric/Behavioral:  Negative for dysphoric mood.    Objective:  BP (!) 150/108   Pulse 85   Temp 98 F (36.7 C) (Oral)   Ht 5' 11 (1.803 m)   Wt 234 lb (106.1 kg)  SpO2 96%   BMI 32.64 kg/m   Wt Readings from Last 3 Encounters:  06/19/24 234 lb (106.1 kg)  06/15/24 230 lb (104.3 kg)  02/17/23 231 lb (104.8 kg)      Physical Exam Constitutional:      Appearance: He is well-developed. He is obese.  HENT:     Head: Normocephalic.     Right Ear: Hearing normal.     Left Ear: Hearing normal.     Nose: Nose normal.  Neck:     Thyroid: No thyroid mass or thyromegaly.     Vascular: No carotid bruit.     Trachea: Trachea normal.  Cardiovascular:     Rate and Rhythm: Normal rate and regular rhythm.     Pulses: Normal pulses.     Heart sounds: Heart sounds not distant. No murmur heard.    No friction rub. No gallop.     Comments: No peripheral  edema Pulmonary:     Effort: Pulmonary effort is normal. No respiratory distress.     Breath sounds: Normal breath sounds.  Skin:    General: Skin is warm and dry.     Findings: No rash.  Psychiatric:        Speech: Speech normal.        Behavior: Behavior normal.        Thought Content: Thought content normal.       Results for orders placed or performed in visit on 03/24/23  TIQ-MISC   Collection Time: 03/24/23  3:53 PM  Result Value Ref Range   QUESTION/PROBLEM:     QUESTION: No serum received for reflex test   CBC With Differential/Platelet   Collection Time: 03/24/23  3:53 PM  Result Value Ref Range   WBC 7.5 3.8 - 10.8 Thousand/uL   RBC 5.45 4.20 - 5.80 Million/uL   Hemoglobin 14.8 13.2 - 17.1 g/dL   HCT 55.1 61.4 - 49.9 %   MCV 82.2 80.0 - 100.0 fL   MCH 27.2 27.0 - 33.0 pg   MCHC 33.0 32.0 - 36.0 g/dL   RDW 87.0 88.9 - 84.9 %   Platelets 346 140 - 400 Thousand/uL   MPV 9.5 7.5 - 12.5 fL   Neutro Abs 4,643 1,500 - 7,800 cells/uL   Lymphs Abs 2,168 850 - 3,900 cells/uL   Absolute Monocytes 420 200 - 950 cells/uL   Eosinophils Absolute 210 15 - 500 cells/uL   Basophils Absolute 60 0 - 200 cells/uL   Neutrophils Relative % 61.9 %   Total Lymphocyte 28.9 %   Monocytes Relative 5.6 %   Eosinophils Relative 2.8 %   Basophils Relative 0.8 %  Sedimentation rate   Collection Time: 03/24/23  3:53 PM  Result Value Ref Range   Sed Rate 2 0 - 15 mm/h  CALPROTECTIN   Collection Time: 03/28/23  8:59 AM   Specimen: Stool  Result Value Ref Range   Calprotectin 27 mcg/g  C. difficile GDH and Toxin A/B   Collection Time: 03/28/23  8:59 AM  Result Value Ref Range   MICRO NUMBER: [id number]    SPECIMEN QUALITY: Adequate    Source STOOL    STATUS: FINAL    GDH ANTIGEN Not Detected    TOXIN A AND B Not Detected    COMMENT      No toxigenic C. difficile detected For additional information, please refer to http://education.[facility].com/faq/FAQ136 (This link is being  provided for informational/educational purposes only.)  Giardia antigen   Collection Time: 03/28/23  9:00 AM   Specimen: Stool  Result Value Ref Range   MICRO NUMBER: [id number]    SPECIMEN QUALITY: Adequate    Source: STOOL    STATUS: FINAL    RESULT: Not Detected    COMMENT:      NOTE: Due to intermittent shedding, one negative sample does not necessarily rule out the presence of a parasitic infection.  Gastrointestinal Pathogen Pnl RT, PCR   Collection Time: 03/28/23  9:00 AM  Result Value Ref Range   CampyloBacter Group NOT DETECTED NOT DETECTED   Salmonella species NOT DETECTED NOT DETECTED   Shigella Species NOT DETECTED NOT DETECTED   Vibrio Group NOT DETECTED NOT DETECTED   Yersinia enterocolitica NOT DETECTED NOT DETECTED   Shiga Toxin 1 NOT DETECTED NOT DETECTED   Shiga Toxin 2 NOT DETECTED NOT DETECTED   Norovirus GI/GII NOT DETECTED NOT DETECTED   Rotavirus A NOT DETECTED NOT DETECTED    Assessment and Plan  Essential hypertension Assessment & Plan: Chronic, acute worsening.  Patient had been maintained off of blood pressure medicine for a while but blood pressure is trending up. Will check for secondary causes, endorgan damage and associated risk factors with labs and EKG.  Stop afrin as may be contributing.  Recommend weight modification, reinitiation of lisinopril  if renal function tests are normal.  Low-salt heart healthy diet and regular exercise.  Close follow-up with PCP in 2 weeks for repeat check blood pressure.  Orders: -     Hemoglobin A1c; Future -     Lipid panel; Future -     Comprehensive metabolic panel with GFR; Future -     TSH; Future -     CBC with Differential/Platelet; Future  Class 1 obesity due to excess calories with serious comorbidity and body mass index (BMI) of 32.0 to 32.9 in adult  Chronic sinusitis, unspecified location Assessment & Plan:  Chronic, no sign of infection... stop afrin.. start daily nasal saline  spray/irrigation.  Start back on nasal ipratropium BID  Orders: -     Ipratropium Bromide ; Place 2 sprays into both nostrils every 12 (twelve) hours.  Dispense: 30 mL; Refill: 12  Other orders -     Lisinopril ; Take 1 tablet (10 mg total) by mouth daily.  Dispense: 30 tablet; Refill: 11    Return in about 2 weeks (around 07/03/2024) for  follow up HTN with PCP or Aryanne Gilleland.   Greig Ring, MD

## 2024-06-20 ENCOUNTER — Ambulatory Visit: Payer: Self-pay | Admitting: Family Medicine

## 2024-06-20 LAB — HEMOGLOBIN A1C: Hgb A1c MFr Bld: 5.1 % (ref 4.6–6.5)

## 2024-07-04 ENCOUNTER — Telehealth: Payer: Self-pay

## 2024-07-04 NOTE — Telephone Encounter (Signed)
 Noted

## 2024-07-04 NOTE — Telephone Encounter (Signed)
 Copied from CRM #[id number]. Topic: General - Other >> Jul 04, 2024 10:26 AM Mercedes MATSU wrote: Reason for CRM: Patient asked for a call back from Dr Avelina, she told him to keep track of his bp. Patient states the medication works during the day, but in the evening it is running high. Can be reached at [phone].

## 2024-07-04 NOTE — Telephone Encounter (Signed)
 Pt saw Dr Avelina 06/19/24. Looks like pt had HTN f/u for 07/05/24 with Mallie but r/s for 07/18/24.   Spoke with pt to get recent BP readings. Pt states he's currently at work and doesn't have the numbers with him. Says he just basically wanted to let Dr Avelina know what was going on. Notified pt his message will be fwd to Dr Avelina and Mallie. Pt expresses his thanks. Pls advise.

## 2024-07-05 ENCOUNTER — Ambulatory Visit: Admitting: Primary Care

## 2024-07-18 ENCOUNTER — Ambulatory Visit: Payer: Self-pay | Admitting: Primary Care

## 2024-07-18 ENCOUNTER — Encounter: Payer: Self-pay | Admitting: Primary Care

## 2024-07-18 ENCOUNTER — Ambulatory Visit: Admitting: Primary Care

## 2024-07-18 VITALS — BP 124/84 | HR 95 | Temp 97.8°F | Ht 71.0 in | Wt 234.5 lb

## 2024-07-18 DIAGNOSIS — I10 Essential (primary) hypertension: Secondary | ICD-10-CM

## 2024-07-18 DIAGNOSIS — E781 Pure hyperglyceridemia: Secondary | ICD-10-CM | POA: Diagnosis not present

## 2024-07-18 DIAGNOSIS — R7989 Other specified abnormal findings of blood chemistry: Secondary | ICD-10-CM | POA: Diagnosis not present

## 2024-07-18 LAB — COMPREHENSIVE METABOLIC PANEL WITH GFR
ALT: 32 U/L (ref 3–53)
AST: 19 U/L (ref 5–37)
Albumin: 4.8 g/dL (ref 3.5–5.2)
Alkaline Phosphatase: 71 U/L (ref 39–117)
BUN: 11 mg/dL (ref 6–23)
CO2: 30 meq/L (ref 19–32)
Calcium: 10 mg/dL (ref 8.4–10.5)
Chloride: 101 meq/L (ref 96–112)
Creatinine, Ser: 1.1 mg/dL (ref 0.40–1.50)
GFR: 88.71 mL/min (ref >60.00)
Glucose, Bld: 92 mg/dL (ref 70–99)
Potassium: 4.5 meq/L (ref 3.5–5.1)
Sodium: 139 meq/L (ref 135–145)
Total Bilirubin: 0.6 mg/dL (ref 0.2–1.2)
Total Protein: 7.2 g/dL (ref 6.0–8.3)

## 2024-07-18 NOTE — Assessment & Plan Note (Signed)
 Above goal on recent labs  He will work on improving his diet and lifestyle through eating habits and exercise Repeat in 6 months

## 2024-07-18 NOTE — Assessment & Plan Note (Signed)
 Reviewed labs from November 2025. Could be secondary to OTC products versus diet.  Discussed to stop all OTC analgesic medication. Work on weight loss.   Repeat liver function test pending

## 2024-07-18 NOTE — Progress Notes (Signed)
 Subjective:    Patient ID: Kevin Shields, male    DOB: 09-Apr-1991, 33 y.o.   MRN: [id number]  Kevin Shields is a very pleasant 33 y.o. male with a history of hypertension who presents today for follow-up of hypertension.  He was last evaluated on 06/19/2024 for elevated blood pressure readings.  Previously managed on lisinopril  10 mg daily, moved to [place] , recently returned.  Evaluated at the ED on 06/15/24 with asymptomatic hypertension.  No treatment was provided despite elevated blood pressure readings.  During this visit he underwent lab work which revealed hypertriglyceridemia, elevated LFTs.  He was advised to stop Afrin nasal spray for which he was using recurrently and to resume lisinopril  10 mg daily . He is here for  follow-up today.   Since his last visit he is compliant to lisinopril  10 mg daily. He is checking BP at home which is running 150/100s. He has a wrist cuff. He has not been exercising for the last 1 year.   He does not drink alcohol. He had been using Anastacia Powder recurrently for a caffeine boost and sinus pain. His most recent ALT was 117 and AST was 38.   BP Readings from Last 3 Encounters:  07/18/24 124/84  06/19/24 (!) 150/108  06/15/24 (!) 143/109   Wt Readings from Last 3 Encounters:  07/18/24 234 lb 8 oz (106.4 kg)  06/19/24 234 lb (106.1 kg)  06/15/24 230 lb (104.3 kg)      Review of Systems  Respiratory:  Negative for shortness of breath.   Cardiovascular:  Negative for chest pain.  Neurological:  Negative for dizziness and headaches.         Past Medical History:  Diagnosis Date   Elevated blood pressure reading    Periumbilical abdominal pain 02/17/2023    Social History   Socioeconomic History   Marital status: Single    Spouse name: Not on file   Number of children: Not on file   Years of education: Not on file   Highest education level: Not on file  Occupational History   Not on file  Tobacco Use   Smoking status: Former    Types:  Cigarettes   Smokeless tobacco: Never   Tobacco comments:    Quit 3 yeaars ago  Vaping Use   Vaping status: Never Used  Substance and Sexual Activity   Alcohol use: Not Currently   Drug use: Not Currently   Sexual activity: Not on file  Other Topics Concern   Not on file  Social History Narrative   Single.   No children.   Works in holiday representative.   Enjoys spending time outdoors, baseball, basketball.   Social Drivers of Health   Tobacco Use: Medium Risk (07/18/2024)   Patient History    Smoking Tobacco Use: Former    Smokeless Tobacco Use: Never    Passive Exposure: Not on Actuary Strain: Not on file  Food Insecurity: Not on file  Transportation Needs: Not on file  Physical Activity: Not on file  Stress: Not on file  Social Connections: Not on file  Intimate Partner Violence: Not on file  Depression (PHQ2-9): Low Risk (07/18/2024)   Depression (PHQ2-9)    PHQ-2 Score: 2  Alcohol Screen: Not on file  Housing: Not on file  Utilities: Not on file  Health Literacy: Not on file    History reviewed. No pertinent surgical history.  Family History  Problem Relation Age of Onset   Hypertension Mother  Diabetes Mother    Heart attack Maternal Grandmother    Stroke Maternal Grandmother    Hypertension Paternal Grandfather     Allergies[1]  Medications Ordered Prior to Encounter[2]  BP 124/84   Pulse 95   Temp 97.8 F (36.6 C) (Oral)   Ht 5' 11 (1.803 m)   Wt 234 lb 8 oz (106.4 kg)   SpO2 97%   BMI 32.71 kg/m  Objective:   Physical Exam Cardiovascular:     Rate and Rhythm: Normal rate and regular rhythm.  Pulmonary:     Effort: Pulmonary effort is normal.     Breath sounds: Normal breath sounds.  Musculoskeletal:     Cervical back: Neck supple.  Skin:    General: Skin is warm and dry.  Neurological:     Mental Status: He is alert and oriented to person, place, and time.  Psychiatric:        Mood and Affect: Mood normal.      Physical Exam        Assessment & Plan:  Essential hypertension Assessment & Plan: Improved in the office here, home readings are above goal.  Discussed to obtain an arm cuff.   Continue lisinopril  10 mg daily. BMP pending.  Orders: -     Comprehensive metabolic panel with GFR  Elevated LFTs Assessment & Plan: Reviewed labs from November 2025. Could be secondary to OTC products versus diet.  Discussed to stop all OTC analgesic medication. Work on weight loss.   Repeat liver function test pending  Orders: -     Comprehensive metabolic panel with GFR  Hypertriglyceridemia Assessment & Plan: Above goal on recent labs  He will work on improving his diet and lifestyle through eating habits and exercise Repeat in 6 months     Assessment and Plan Assessment & Plan         Comer MARLA Gaskins, NP       [1]  Allergies Allergen Reactions   Penicillins Hives   Vantin [Cefpodoxime]   [2]  Current Outpatient Medications on File Prior to Visit  Medication Sig Dispense Refill   ipratropium (ATROVENT ) 0.03 % nasal spray Place 2 sprays into both nostrils every 12 (twelve) hours. 30 mL 12   lisinopril  (ZESTRIL ) 10 MG tablet Take 1 tablet (10 mg total) by mouth daily. 30 tablet 11   triamcinolone  cream (KENALOG ) 0.1 % Apply 1 application topically 2 (two) times daily. 30 g 0   No current facility-administered medications on file prior to visit.

## 2024-07-18 NOTE — Assessment & Plan Note (Signed)
 Improved in the office here, home readings are above goal.  Discussed to obtain an arm cuff.   Continue lisinopril  10 mg daily. BMP pending.

## 2024-07-18 NOTE — Patient Instructions (Signed)
 Stop by the lab prior to leaving today. I will notify you of your results once received.   Please schedule a physical to meet with me in 6 months.   It was a pleasure to see you today!

## 2024-07-26 ENCOUNTER — Encounter: Payer: Self-pay | Admitting: Emergency Medicine

## 2024-07-26 ENCOUNTER — Ambulatory Visit: Admission: EM | Admit: 2024-07-26 | Discharge: 2024-07-26 | Disposition: A | Discharge status: Home / Self Care

## 2024-07-26 DIAGNOSIS — J209 Acute bronchitis, unspecified: Secondary | ICD-10-CM

## 2024-07-26 MED ORDER — PREDNISONE 50 MG PO TABS: ORAL_TABLET | ORAL | 0 refills | Status: DC

## 2024-07-26 MED ORDER — ALBUTEROL SULFATE HFA 108 (90 BASE) MCG/ACT IN AERS: 2.0000 | INHALATION_SPRAY | RESPIRATORY_TRACT | 0 refills | Status: AC | PRN

## 2024-07-26 MED ORDER — BENZONATATE 100 MG PO CAPS: 100.0000 mg | ORAL_CAPSULE | Freq: Three times a day (TID) | ORAL | 0 refills | Status: DC

## 2024-07-26 MED ORDER — GUAIFENESIN ER 600 MG PO TB12: 600.0000 mg | ORAL_TABLET | Freq: Two times a day (BID) | ORAL | 0 refills | Status: AC

## 2024-07-26 NOTE — ED Triage Notes (Addendum)
 Pt reports chest congestion and productive cough with sticky sputum x8-9 days. Had fevers within the first couple of days. His mom is a engineer, civil (consulting) and tested him for flu - negative. Mucinex , cough syrup, coricidin taken with little relief. Has a hard time sleeping at night due to the congestion and has periods of SOB due to the mucus. Expiratory wheezes intermittently.

## 2024-07-27 NOTE — ED Provider Notes (Signed)
" [facility]    CSN: [id number] Arrival date & time: 07/26/24  1858      History   Chief Complaint Chief Complaint  Patient presents with   chest congestion   Cough    HPI Kevin Shields is a 33 y.o. male.   Pt presents today due to 8-9 days of cough productive of brownish-green sputum, chest tightness, subjective fevers, and wheezing. Pt states that his symptoms are worse when he is supine at night. Pt tested neg for flu at home. Pt has been using Mucinex , cough syrup, and Coricidin for symptoms without much relief. Pt states that he is eating and drinking and denies nausea and vomiting.   The history is provided by the patient.  Cough   Past Medical History:  Diagnosis Date   Elevated blood pressure reading    Periumbilical abdominal pain 02/17/2023    Patient Active Problem List   Diagnosis Date Noted   Elevated LFTs 07/18/2024   Hypertriglyceridemia 07/18/2024   Chronic sinusitis 06/19/2024   Preventative health care 07/18/2016   Essential hypertension 02/15/2016   Eczema 02/15/2016    History reviewed. No pertinent surgical history.     Home Medications    Prior to Admission medications  Medication Sig Start Date End Date Taking? Authorizing Provider  albuterol  (VENTOLIN  HFA) 108 (90 Base) MCG/ACT inhaler Inhale 2 puffs into the lungs every 4 (four) hours as needed for wheezing or shortness of breath. 07/26/24  Yes Andra Corean BROCKS, PA-C  benzonatate  (TESSALON ) 100 MG capsule Take 1 capsule (100 mg total) by mouth every 8 (eight) hours. 07/26/24  Yes Andra Corean BROCKS, PA-C  guaiFENesin  (MUCINEX ) 600 MG 12 hr tablet Take 1 tablet (600 mg total) by mouth 2 (two) times daily for 10 days. 07/26/24 08/05/24 Yes Andra Corean C, PA-C  ipratropium (ATROVENT ) 0.03 % nasal spray Place 2 sprays into both nostrils every 12 (twelve) hours. 06/19/24  Yes Bedsole, Amy E, MD  lisinopril  (ZESTRIL ) 10 MG tablet Take 1 tablet (10 mg total) by mouth  daily. 06/19/24  Yes Bedsole, Amy E, MD  predniSONE  (DELTASONE ) 50 MG tablet Take 1 tab po daily for 5 days. 07/26/24  Yes Andra Corean C, PA-C  triamcinolone  cream (KENALOG ) 0.1 % Apply 1 application topically 2 (two) times daily. 04/26/21  Yes Loeffler, Ronnald BROCKS, PA-C    Family History Family History  Problem Relation Age of Onset   Hypertension Mother    Diabetes Mother    Heart attack Maternal Grandmother    Stroke Maternal Grandmother    Hypertension Paternal Grandfather     Social History Social History[1]   Allergies   Penicillins and Vantin [cefpodoxime]   Review of Systems Review of Systems  Respiratory:  Positive for cough.      Physical Exam Triage Vital Signs ED Triage Vitals  Encounter Vitals Group     BP 07/26/24 1944 (!) 141/90     Girls Systolic BP Percentile --      Girls Diastolic BP Percentile --      Boys Systolic BP Percentile --      Boys Diastolic BP Percentile --      Pulse Rate 07/26/24 1944 99     Resp 07/26/24 1944 18     Temp 07/26/24 1944 98.2 F (36.8 C)     Temp Source 07/26/24 1944 Oral     SpO2 07/26/24 1944 93 %     Weight --      Height --  Head Circumference --      Peak Flow --      Pain Score 07/26/24 2012 0     Pain Loc --      Pain Education --      Exclude from Growth Chart --    No data found.  Updated Vital Signs BP (!) 141/90 (BP Location: Left Arm)   Pulse 99   Temp 98.2 F (36.8 C) (Oral)   Resp 18   SpO2 93%   Visual Acuity Right Eye Distance:   Left Eye Distance:   Bilateral Distance:    Right Eye Near:   Left Eye Near:    Bilateral Near:     Physical Exam Vitals and nursing note reviewed.  Constitutional:      General: He is not in acute distress.    Appearance: Normal appearance. He is not ill-appearing, toxic-appearing or diaphoretic.  Eyes:     General: No scleral icterus. Cardiovascular:     Rate and Rhythm: Normal rate and regular rhythm.     Heart sounds: Normal heart  sounds.  Pulmonary:     Effort: Pulmonary effort is normal. No respiratory distress.     Breath sounds: Normal breath sounds. No wheezing or rhonchi.  Skin:    General: Skin is warm.  Neurological:     Mental Status: He is alert and oriented to person, place, and time.  Psychiatric:        Mood and Affect: Mood normal.        Behavior: Behavior normal.      UC Treatments / Results  Labs (all labs ordered are listed, but only abnormal results are displayed) Labs Reviewed - No data to display  EKG   Radiology No results found.  Procedures Procedures (including critical care time)  Medications Ordered in UC Medications - No data to display  Initial Impression / Assessment and Plan / UC Course  I have reviewed the triage vital signs and the nursing notes.  Pertinent labs & imaging results that were available during my care of the patient were reviewed by me and considered in my medical decision making (see chart for details).     Final Clinical Impressions(s) / UC Diagnoses   Final diagnoses:  Acute bronchitis, unspecified organism   Discharge Instructions   None    ED Prescriptions     Medication Sig Dispense Auth. Provider   benzonatate  (TESSALON ) 100 MG capsule Take 1 capsule (100 mg total) by mouth every 8 (eight) hours. 30 capsule Andra Krabbe C, PA-C   guaiFENesin  (MUCINEX ) 600 MG 12 hr tablet Take 1 tablet (600 mg total) by mouth 2 (two) times daily for 10 days. 20 tablet Andra Krabbe C, PA-C   predniSONE  (DELTASONE ) 50 MG tablet Take 1 tab po daily for 5 days. 5 tablet Andra Krabbe C, PA-C   albuterol  (VENTOLIN  HFA) 108 (90 Base) MCG/ACT inhaler Inhale 2 puffs into the lungs every 4 (four) hours as needed for wheezing or shortness of breath. 8 g Andra Krabbe BROCKS, PA-C      PDMP not reviewed this encounter.    [1]  Social History Tobacco Use   Smoking status: Former    Types: Cigarettes   Smokeless tobacco: Never    Tobacco comments:    Quit 3 yeaars ago  Vaping Use   Vaping status: Never Used  Substance Use Topics   Alcohol use: Not Currently   Drug use: Not Currently     Andra Krabbe BROCKS, PA-C  07/27/24 0832 ° °"

## 2024-07-30 ENCOUNTER — Telehealth: Admitting: Emergency Medicine

## 2024-07-30 DIAGNOSIS — B9689 Other specified bacterial agents as the cause of diseases classified elsewhere: Secondary | ICD-10-CM | POA: Diagnosis not present

## 2024-07-30 DIAGNOSIS — J208 Acute bronchitis due to other specified organisms: Secondary | ICD-10-CM | POA: Diagnosis not present

## 2024-07-30 MED ORDER — PROMETHAZINE-DM 6.25-15 MG/5ML PO SYRP: 5.0000 mL | ORAL_SOLUTION | Freq: Four times a day (QID) | ORAL | 0 refills | Status: DC | PRN

## 2024-07-30 MED ORDER — DOXYCYCLINE HYCLATE 100 MG PO TABS: 100.0000 mg | ORAL_TABLET | Freq: Two times a day (BID) | ORAL | 0 refills | Status: AC

## 2024-07-30 NOTE — Progress Notes (Signed)
 We are sorry that you are not feeling well.  Here is how we plan to help!  Based on your presentation I believe you most likely have A cough due to bacteria.  When patients have a fever and a productive cough with a change in color or increased sputum production, we are concerned about bacterial bronchitis.  If left untreated it can progress to pneumonia.  If your symptoms do not improve with your treatment plan it is important that you contact your provider.   I have prescribed Doxycycline  100 mg twice a day for 7 days     In addition you may use promethazine DM, a cough medicine.   I also recommend you take Mucinex  (or generic guaifenesin )   From your responses in the eVisit questionnaire you describe inflammation in the upper respiratory tract which is causing a significant cough.  This is commonly called Bronchitis and has four common causes:   Allergies Viral Infections Acid Reflux Bacterial Infection Allergies, viruses and acid reflux are treated by controlling symptoms or eliminating the cause. An example might be a cough caused by taking certain blood pressure medications. You stop the cough by changing the medication. Another example might be a cough caused by acid reflux. Controlling the reflux helps control the cough.  USE OF BRONCHODILATOR (RESCUE) INHALERS: There is a risk from using your bronchodilator too frequently.  The risk is that over-reliance on a medication which only relaxes the muscles surrounding the breathing tubes can reduce the effectiveness of medications prescribed to reduce swelling and congestion of the tubes themselves.  Although you feel brief relief from the bronchodilator inhaler, your asthma may actually be worsening with the tubes becoming more swollen and filled with mucus.  This can delay other crucial treatments, such as oral steroid medications. If you need to use a bronchodilator inhaler daily, several times per day, you should discuss this with your  provider.  There are probably better treatments that could be used to keep your asthma under control.     HOME CARE Only take medications as instructed by your medical team. Complete the entire course of an antibiotic. Drink plenty of fluids and get plenty of rest. Avoid close contacts especially the very young and the elderly Cover your mouth if you cough or cough into your sleeve. Always remember to wash your hands A steam or ultrasonic humidifier can help congestion.   GET HELP RIGHT AWAY IF: You develop worsening fever. You become short of breath You cough up blood. Your symptoms persist after you have completed your treatment plan MAKE SURE YOU  Understand these instructions. Will watch your condition. Will get help right away if you are not doing well or get worse.  Your e-visit answers were reviewed by a board certified advanced clinical practitioner to complete your personal care plan.  Depending on the condition, your plan could have included both over the counter or prescription medications. If there is a problem please reply  once you have received a response from your provider. Your safety is important to us .  If you have drug allergies check your prescription carefully.    You can use MyChart to ask questions about todays visit, request a non-urgent call back, or ask for a work or school excuse for 24 hours related to this e-Visit. If it has been greater than 24 hours you will need to follow up with your provider, or enter a new e-Visit to address those concerns. You will get an e-mail in  the next two days asking about your experience.  I hope that your e-visit has been valuable and will speed your recovery. Thank you for using e-visits.   I have spent 5 minutes in review of e-visit questionnaire, review and updating patient chart, medical decision making and response to patient.   Jon Belt, PhD, FNP-BC

## 2024-09-01 ENCOUNTER — Other Ambulatory Visit: Payer: Self-pay | Admitting: Emergency Medicine

## 2024-09-02 ENCOUNTER — Other Ambulatory Visit: Payer: Self-pay | Admitting: Emergency Medicine

## 2024-09-03 ENCOUNTER — Ambulatory Visit: Payer: Self-pay | Admitting: Family Medicine

## 2024-09-03 ENCOUNTER — Ambulatory Visit (INDEPENDENT_AMBULATORY_CARE_PROVIDER_SITE_OTHER)
Admission: RE | Admit: 2024-09-03 | Discharge: 2024-09-03 | Disposition: A | Source: Ambulatory Visit | Attending: Family Medicine | Admitting: Family Medicine

## 2024-09-03 ENCOUNTER — Encounter: Payer: Self-pay | Admitting: Family Medicine

## 2024-09-03 ENCOUNTER — Ambulatory Visit: Admitting: Family Medicine

## 2024-09-03 ENCOUNTER — Ambulatory Visit: Payer: Self-pay

## 2024-09-03 VITALS — BP 140/82 | HR 127 | Temp 100.2°F | Ht 71.0 in | Wt 228.0 lb

## 2024-09-03 DIAGNOSIS — R051 Acute cough: Secondary | ICD-10-CM | POA: Insufficient documentation

## 2024-09-03 LAB — POC INFLUENZA A&B (BINAX/QUICKVUE)
Influenza A, POC: NEGATIVE
Influenza B, POC: NEGATIVE

## 2024-09-03 LAB — POC COVID19 BINAXNOW: SARS Coronavirus 2 Ag: NEGATIVE

## 2024-09-03 MED ORDER — LEVOFLOXACIN 500 MG PO TABS: 500.0000 mg | ORAL_TABLET | Freq: Every day | ORAL | 0 refills | Status: AC

## 2024-09-03 NOTE — Telephone Encounter (Signed)
 Patient seeing Dr. Avelina today

## 2024-09-03 NOTE — Assessment & Plan Note (Addendum)
 Acute, recent bronchitis with cough that never improved despite prednisone  and doxycycline . Now with new fever, chills and tachycardia. Negative COVID and flu test. Given rhonchi in right base will evaluate with chest x-ray for community-acquired pneumonia.  Otherwise likely sinus infection. Penicillin allergic... Caused hives unclear if tolerant to cephalosporins  Will treat with broadened antibiotic depending on chest x-ray results. Patient likely dehydrated mildly: Given able to take an oral, encouraged p.o. water intake with electrolytes and to stop caffeine. Rest and time.  Encouraged patient to remain out of work until 24 hours after fever resolved on antibiotics.  Return and ER precautions provided.

## 2024-09-03 NOTE — Telephone Encounter (Signed)
 Noted. Agree with nursing triage decision. Appreciate Dr Sherrel evaluation.

## 2024-09-03 NOTE — Telephone Encounter (Signed)
 Next Appt With Family Medicine Darra Ring, MD) 09/03/2024 at 3:00 PM

## 2025-01-16 ENCOUNTER — Encounter: Admitting: Primary Care
